# Patient Record
Sex: Male | Born: 1980 | Race: White | Hispanic: No | Marital: Married | Smoking: Former smoker
Health system: Southern US, Community
[De-identification: ages and names within clinical notes are randomized; demographics above are authoritative.]

## PROBLEM LIST (undated history)

## (undated) DIAGNOSIS — D6800 Von Willebrand disease, unspecified: Secondary | ICD-10-CM

## (undated) DIAGNOSIS — Z720 Tobacco use: Secondary | ICD-10-CM

## (undated) DIAGNOSIS — R000 Tachycardia, unspecified: Secondary | ICD-10-CM

## (undated) DIAGNOSIS — S82892A Other fracture of left lower leg, initial encounter for closed fracture: Secondary | ICD-10-CM

---
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
## 1998-02-18 ENCOUNTER — Emergency Department (HOSPITAL_COMMUNITY): Admission: EM | Admit: 1998-02-18 | Discharge: 1998-02-18 | Payer: Self-pay | Admitting: Emergency Medicine

## 1998-05-02 ENCOUNTER — Encounter: Payer: Self-pay | Admitting: Gastroenterology

## 1998-05-02 ENCOUNTER — Ambulatory Visit (HOSPITAL_COMMUNITY): Admission: RE | Admit: 1998-05-02 | Discharge: 1998-05-02 | Payer: Self-pay | Admitting: Gastroenterology

## 1998-05-09 ENCOUNTER — Encounter: Payer: Self-pay | Admitting: Critical Care Medicine

## 1998-05-09 ENCOUNTER — Inpatient Hospital Stay (HOSPITAL_COMMUNITY): Admission: AD | Admit: 1998-05-09 | Discharge: 1998-05-10 | Payer: Self-pay | Admitting: Critical Care Medicine

## 1999-05-06 HISTORY — PX: CYSTIC HYGROMA EXCISION: SHX450

## 1999-08-05 ENCOUNTER — Inpatient Hospital Stay (HOSPITAL_COMMUNITY): Admission: EM | Admit: 1999-08-05 | Discharge: 1999-08-06 | Payer: Self-pay | Admitting: Emergency Medicine

## 1999-08-05 ENCOUNTER — Encounter: Payer: Self-pay | Admitting: Emergency Medicine

## 1999-09-16 ENCOUNTER — Encounter: Payer: Self-pay | Admitting: Urology

## 1999-09-16 ENCOUNTER — Encounter: Admission: RE | Admit: 1999-09-16 | Discharge: 1999-09-16 | Payer: Self-pay | Admitting: Urology

## 1999-10-10 ENCOUNTER — Inpatient Hospital Stay (HOSPITAL_COMMUNITY): Admission: RE | Admit: 1999-10-10 | Discharge: 1999-10-14 | Payer: Self-pay | Admitting: General Surgery

## 1999-10-12 ENCOUNTER — Encounter: Payer: Self-pay | Admitting: General Surgery

## 1999-12-31 ENCOUNTER — Emergency Department (HOSPITAL_COMMUNITY): Admission: EM | Admit: 1999-12-31 | Discharge: 1999-12-31 | Payer: Self-pay | Admitting: Emergency Medicine

## 2000-01-03 ENCOUNTER — Encounter: Payer: Self-pay | Admitting: General Surgery

## 2000-01-03 ENCOUNTER — Encounter: Admission: RE | Admit: 2000-01-03 | Discharge: 2000-01-03 | Payer: Self-pay | Admitting: General Surgery

## 2000-01-04 ENCOUNTER — Encounter: Payer: Self-pay | Admitting: General Surgery

## 2000-01-04 ENCOUNTER — Inpatient Hospital Stay (HOSPITAL_COMMUNITY): Admission: AD | Admit: 2000-01-04 | Discharge: 2000-01-06 | Payer: Self-pay | Admitting: General Surgery

## 2000-06-04 ENCOUNTER — Emergency Department (HOSPITAL_COMMUNITY): Admission: EM | Admit: 2000-06-04 | Discharge: 2000-06-04 | Payer: Self-pay | Admitting: Emergency Medicine

## 2000-08-04 ENCOUNTER — Emergency Department (HOSPITAL_COMMUNITY): Admission: EM | Admit: 2000-08-04 | Discharge: 2000-08-04 | Payer: Self-pay | Admitting: Emergency Medicine

## 2000-08-04 ENCOUNTER — Encounter: Payer: Self-pay | Admitting: Emergency Medicine

## 2002-10-18 ENCOUNTER — Encounter: Payer: Self-pay | Admitting: Emergency Medicine

## 2002-10-18 ENCOUNTER — Emergency Department (HOSPITAL_COMMUNITY): Admission: EM | Admit: 2002-10-18 | Discharge: 2002-10-18 | Payer: Self-pay | Admitting: Emergency Medicine

## 2003-08-09 ENCOUNTER — Emergency Department (HOSPITAL_COMMUNITY): Admission: EM | Admit: 2003-08-09 | Discharge: 2003-08-09 | Payer: Self-pay | Admitting: Emergency Medicine

## 2003-10-19 ENCOUNTER — Emergency Department (HOSPITAL_COMMUNITY): Admission: EM | Admit: 2003-10-19 | Discharge: 2003-10-19 | Payer: Self-pay | Admitting: Family Medicine

## 2003-10-22 ENCOUNTER — Emergency Department (HOSPITAL_COMMUNITY): Admission: EM | Admit: 2003-10-22 | Discharge: 2003-10-22 | Payer: Self-pay | Admitting: Emergency Medicine

## 2005-10-01 ENCOUNTER — Emergency Department (HOSPITAL_COMMUNITY): Admission: EM | Admit: 2005-10-01 | Discharge: 2005-10-01 | Payer: Self-pay | Admitting: Emergency Medicine

## 2008-03-22 ENCOUNTER — Emergency Department: Payer: Self-pay

## 2008-12-03 ENCOUNTER — Emergency Department: Payer: Self-pay | Admitting: Emergency Medicine

## 2008-12-13 ENCOUNTER — Ambulatory Visit: Payer: Self-pay

## 2009-04-04 HISTORY — PX: KNEE ARTHROSCOPY: SHX127

## 2009-04-06 ENCOUNTER — Ambulatory Visit: Payer: Self-pay | Admitting: Unknown Physician Specialty

## 2009-04-13 ENCOUNTER — Ambulatory Visit: Payer: Self-pay | Admitting: Unknown Physician Specialty

## 2009-07-18 ENCOUNTER — Emergency Department: Payer: Self-pay | Admitting: Emergency Medicine

## 2010-12-11 ENCOUNTER — Emergency Department: Payer: Self-pay | Admitting: Emergency Medicine

## 2011-07-08 ENCOUNTER — Emergency Department: Payer: Self-pay | Admitting: *Deleted

## 2012-08-28 ENCOUNTER — Inpatient Hospital Stay: Admit: 2012-08-28 | Payer: Self-pay | Admitting: Orthopedic Surgery

## 2012-08-28 ENCOUNTER — Encounter (HOSPITAL_COMMUNITY): Payer: Self-pay | Admitting: Emergency Medicine

## 2012-08-28 ENCOUNTER — Emergency Department (HOSPITAL_COMMUNITY): Payer: Worker's Compensation

## 2012-08-28 ENCOUNTER — Encounter (HOSPITAL_COMMUNITY)
Admission: EM | Disposition: A | Discharge status: Home Health | Payer: Self-pay | Source: Home / Self Care | Attending: Orthopedic Surgery

## 2012-08-28 ENCOUNTER — Encounter (HOSPITAL_COMMUNITY): Payer: Self-pay | Admitting: Anesthesiology

## 2012-08-28 ENCOUNTER — Inpatient Hospital Stay (HOSPITAL_COMMUNITY): Payer: Worker's Compensation

## 2012-08-28 ENCOUNTER — Emergency Department (HOSPITAL_COMMUNITY): Payer: Worker's Compensation | Admitting: Anesthesiology

## 2012-08-28 ENCOUNTER — Inpatient Hospital Stay (HOSPITAL_COMMUNITY)
Admission: EM | Admit: 2012-08-28 | Discharge: 2012-08-31 | DRG: 493 | Disposition: A | Discharge status: Home Health | Payer: Worker's Compensation | Attending: Orthopedic Surgery | Admitting: Orthopedic Surgery

## 2012-08-28 DIAGNOSIS — S92302A Fracture of unspecified metatarsal bone(s), left foot, initial encounter for closed fracture: Secondary | ICD-10-CM

## 2012-08-28 DIAGNOSIS — Z885 Allergy status to narcotic agent status: Secondary | ICD-10-CM

## 2012-08-28 DIAGNOSIS — Z880 Allergy status to penicillin: Secondary | ICD-10-CM

## 2012-08-28 DIAGNOSIS — W11XXXA Fall on and from ladder, initial encounter: Secondary | ICD-10-CM | POA: Diagnosis present

## 2012-08-28 DIAGNOSIS — Y9269 Other specified industrial and construction area as the place of occurrence of the external cause: Secondary | ICD-10-CM

## 2012-08-28 DIAGNOSIS — D6800 Von Willebrand disease, unspecified: Secondary | ICD-10-CM | POA: Diagnosis present

## 2012-08-28 DIAGNOSIS — S52109A Unspecified fracture of upper end of unspecified radius, initial encounter for closed fracture: Secondary | ICD-10-CM | POA: Diagnosis present

## 2012-08-28 DIAGNOSIS — W19XXXA Unspecified fall, initial encounter: Secondary | ICD-10-CM

## 2012-08-28 DIAGNOSIS — S82409A Unspecified fracture of shaft of unspecified fibula, initial encounter for closed fracture: Principal | ICD-10-CM | POA: Diagnosis present

## 2012-08-28 DIAGNOSIS — S82209A Unspecified fracture of shaft of unspecified tibia, initial encounter for closed fracture: Principal | ICD-10-CM | POA: Diagnosis present

## 2012-08-28 DIAGNOSIS — S92309A Fracture of unspecified metatarsal bone(s), unspecified foot, initial encounter for closed fracture: Secondary | ICD-10-CM | POA: Diagnosis present

## 2012-08-28 DIAGNOSIS — S82872A Displaced pilon fracture of left tibia, initial encounter for closed fracture: Secondary | ICD-10-CM

## 2012-08-28 DIAGNOSIS — T79A29A Traumatic compartment syndrome of unspecified lower extremity, initial encounter: Secondary | ICD-10-CM | POA: Diagnosis present

## 2012-08-28 DIAGNOSIS — S52102A Unspecified fracture of upper end of left radius, initial encounter for closed fracture: Secondary | ICD-10-CM

## 2012-08-28 DIAGNOSIS — F17200 Nicotine dependence, unspecified, uncomplicated: Secondary | ICD-10-CM | POA: Diagnosis present

## 2012-08-28 DIAGNOSIS — Y990 Civilian activity done for income or pay: Secondary | ICD-10-CM

## 2012-08-28 DIAGNOSIS — S9305XA Dislocation of left ankle joint, initial encounter: Secondary | ICD-10-CM

## 2012-08-28 HISTORY — PX: APPLICATION OF WOUND VAC: SHX5189

## 2012-08-28 HISTORY — DX: Von Willebrand's disease: D68.0

## 2012-08-28 HISTORY — PX: DORSAL COMPARTMENT RELEASE: SHX5039

## 2012-08-28 HISTORY — DX: Von Willebrand disease, unspecified: D68.00

## 2012-08-28 HISTORY — PX: EXTERNAL FIXATION LEG: SHX1549

## 2012-08-28 LAB — CBC WITH DIFFERENTIAL/PLATELET
Basophils Absolute: 0 10*3/uL (ref 0.0–0.1)
Basophils Relative: 0 % (ref 0–1)
Eosinophils Absolute: 0 10*3/uL (ref 0.0–0.7)
Eosinophils Relative: 0 % (ref 0–5)
Hemoglobin: 14.2 g/dL (ref 13.0–17.0)
Lymphocytes Relative: 13 % (ref 12–46)
MCH: 33.5 pg (ref 26.0–34.0)
Monocytes Relative: 6 % (ref 3–12)
RBC: 4.24 MIL/uL (ref 4.22–5.81)
RDW: 12.7 % (ref 11.5–15.5)

## 2012-08-28 LAB — POCT I-STAT, CHEM 8
Calcium, Ion: 1.16 mmol/L (ref 1.12–1.23)
HCT: 41 % (ref 39.0–52.0)
Hemoglobin: 13.9 g/dL (ref 13.0–17.0)
TCO2: 25 mmol/L (ref 0–100)

## 2012-08-28 LAB — PROTIME-INR
INR: 0.94 (ref 0.00–1.49)
Prothrombin Time: 12.5 seconds (ref 11.6–15.2)

## 2012-08-28 LAB — RAPID URINE DRUG SCREEN, HOSP PERFORMED
Benzodiazepines: POSITIVE — AB
Cocaine: NOT DETECTED
Opiates: NOT DETECTED

## 2012-08-28 SURGERY — EXTERNAL FIXATION, LOWER EXTREMITY
Anesthesia: Choice | Laterality: Left

## 2012-08-28 SURGERY — EXTERNAL FIXATION, LOWER EXTREMITY
Anesthesia: General | Site: Leg Lower | Laterality: Left | Wound class: Clean

## 2012-08-28 MED ORDER — SODIUM CHLORIDE 0.9 % IR SOLN
Status: DC | PRN
  Administered 2012-08-28: 1

## 2012-08-28 MED ORDER — VANCOMYCIN HCL 10 G IV SOLR
1500.0000 mg | Freq: Once | INTRAVENOUS | Status: AC
  Administered 2012-08-28: 1500 mg via INTRAVENOUS
  Filled 2012-08-28: qty 1500

## 2012-08-28 MED ORDER — PROPOFOL 10 MG/ML IV BOLUS
0.5000 mg/kg | Freq: Once | INTRAVENOUS | Status: AC
  Administered 2012-08-28: 50 mg via INTRAVENOUS
  Filled 2012-08-28: qty 1

## 2012-08-28 MED ORDER — DIPHENHYDRAMINE HCL 12.5 MG/5ML PO ELIX: 12.5000 mg | ORAL_SOLUTION | Freq: Four times a day (QID) | ORAL | Status: DC | PRN

## 2012-08-28 MED ORDER — FENTANYL CITRATE 0.05 MG/ML IJ SOLN
100.0000 ug | Freq: Once | INTRAMUSCULAR | Status: AC
  Administered 2012-08-28: 100 ug via INTRAVENOUS

## 2012-08-28 MED ORDER — NEOSTIGMINE METHYLSULFATE 1 MG/ML IJ SOLN
INTRAMUSCULAR | Status: DC | PRN
  Administered 2012-08-28: 5 mg via INTRAVENOUS

## 2012-08-28 MED ORDER — DEXAMETHASONE SODIUM PHOSPHATE 4 MG/ML IJ SOLN
INTRAMUSCULAR | Status: DC | PRN
  Administered 2012-08-28: 8 mg via INTRAVENOUS

## 2012-08-28 MED ORDER — FENTANYL CITRATE 0.05 MG/ML IJ SOLN
INTRAMUSCULAR | Status: AC
  Administered 2012-08-28: 100 ug
  Filled 2012-08-28: qty 2

## 2012-08-28 MED ORDER — POTASSIUM CHLORIDE IN NACL 20-0.9 MEQ/L-% IV SOLN
INTRAVENOUS | Status: DC
  Administered 2012-08-29: 75 mL/h via INTRAVENOUS
  Filled 2012-08-28 (×2): qty 1000

## 2012-08-28 MED ORDER — ARTIFICIAL TEARS OP OINT
TOPICAL_OINTMENT | OPHTHALMIC | Status: DC | PRN
  Administered 2012-08-28: 1 via OPHTHALMIC

## 2012-08-28 MED ORDER — MIDAZOLAM HCL 5 MG/5ML IJ SOLN
INTRAMUSCULAR | Status: DC | PRN
  Administered 2012-08-28: 2 mg via INTRAVENOUS

## 2012-08-28 MED ORDER — FENTANYL 10 MCG/ML IV SOLN
INTRAVENOUS | Status: DC
  Administered 2012-08-29 (×2): via INTRAVENOUS
  Filled 2012-08-28 (×2): qty 50

## 2012-08-28 MED ORDER — LACTATED RINGERS IV SOLN
INTRAVENOUS | Status: DC | PRN
  Administered 2012-08-28 (×3): via INTRAVENOUS

## 2012-08-28 MED ORDER — ACETAMINOPHEN 10 MG/ML IV SOLN
1000.0000 mg | Freq: Once | INTRAVENOUS | Status: AC
  Administered 2012-08-28: 1000 mg via INTRAVENOUS
  Filled 2012-08-28: qty 100

## 2012-08-28 MED ORDER — CHLORHEXIDINE GLUCONATE 4 % EX LIQD
60.0000 mL | Freq: Once | CUTANEOUS | Status: DC
  Filled 2012-08-28: qty 60

## 2012-08-28 MED ORDER — ONDANSETRON HCL 4 MG/2ML IJ SOLN: 4.0000 mg | Freq: Once | INTRAMUSCULAR | Status: AC | PRN

## 2012-08-28 MED ORDER — ONDANSETRON HCL 4 MG/2ML IJ SOLN: 4.0000 mg | Freq: Four times a day (QID) | INTRAMUSCULAR | Status: DC | PRN

## 2012-08-28 MED ORDER — ONDANSETRON HCL 4 MG/2ML IJ SOLN
INTRAMUSCULAR | Status: DC | PRN
  Administered 2012-08-28: 4 mg via INTRAVENOUS

## 2012-08-28 MED ORDER — FENTANYL CITRATE 0.05 MG/ML IJ SOLN
100.0000 ug | INTRAMUSCULAR | Status: AC | PRN
  Administered 2012-08-28 (×3): 100 ug via INTRAVENOUS
  Filled 2012-08-28: qty 10

## 2012-08-28 MED ORDER — SODIUM CHLORIDE 0.9 % IJ SOLN: 9.0000 mL | INTRAMUSCULAR | Status: DC | PRN

## 2012-08-28 MED ORDER — SUCCINYLCHOLINE CHLORIDE 20 MG/ML IJ SOLN
INTRAMUSCULAR | Status: DC | PRN
  Administered 2012-08-28: 120 mg via INTRAVENOUS

## 2012-08-28 MED ORDER — DIPHENHYDRAMINE HCL 50 MG/ML IJ SOLN: 12.5000 mg | Freq: Four times a day (QID) | INTRAMUSCULAR | Status: DC | PRN

## 2012-08-28 MED ORDER — FENTANYL CITRATE 0.05 MG/ML IJ SOLN
100.0000 ug | Freq: Once | INTRAMUSCULAR | Status: AC
  Administered 2012-08-28: 100 ug via INTRAVENOUS
  Filled 2012-08-28 (×2): qty 2

## 2012-08-28 MED ORDER — GLYCOPYRROLATE 0.2 MG/ML IJ SOLN
INTRAMUSCULAR | Status: DC | PRN
  Administered 2012-08-28: 0.6 mg via INTRAVENOUS

## 2012-08-28 MED ORDER — VANCOMYCIN HCL IN DEXTROSE 1-5 GM/200ML-% IV SOLN
1000.0000 mg | INTRAVENOUS | Status: DC
  Filled 2012-08-28: qty 200

## 2012-08-28 MED ORDER — FENTANYL CITRATE 0.05 MG/ML IJ SOLN
INTRAMUSCULAR | Status: DC | PRN
  Administered 2012-08-28: 100 ug via INTRAVENOUS
  Administered 2012-08-28: 50 ug via INTRAVENOUS
  Administered 2012-08-28 (×2): 150 ug via INTRAVENOUS
  Administered 2012-08-28 (×3): 50 ug via INTRAVENOUS

## 2012-08-28 MED ORDER — PROPOFOL 10 MG/ML IV BOLUS
INTRAVENOUS | Status: DC | PRN
  Administered 2012-08-28: 180 mg via INTRAVENOUS

## 2012-08-28 MED ORDER — VECURONIUM BROMIDE 10 MG IV SOLR
INTRAVENOUS | Status: DC | PRN
  Administered 2012-08-28: 2 mg via INTRAVENOUS
  Administered 2012-08-28: 8 mg via INTRAVENOUS
  Administered 2012-08-28: 2 mg via INTRAVENOUS

## 2012-08-28 MED ORDER — ACETAMINOPHEN 10 MG/ML IV SOLN: 1000.0000 mg | Freq: Once | INTRAVENOUS | Status: AC | PRN

## 2012-08-28 MED ORDER — NALOXONE HCL 0.4 MG/ML IJ SOLN: 0.4000 mg | INTRAMUSCULAR | Status: DC | PRN

## 2012-08-28 MED ORDER — SODIUM CHLORIDE 0.9 % IV SOLN
INTRAVENOUS | Status: DC
  Administered 2012-08-28 (×2): via INTRAVENOUS

## 2012-08-28 MED ORDER — HYDROMORPHONE HCL PF 1 MG/ML IJ SOLN: 0.2500 mg | INTRAMUSCULAR | Status: DC | PRN

## 2012-08-28 MED ORDER — LIDOCAINE HCL (CARDIAC) 20 MG/ML IV SOLN
INTRAVENOUS | Status: DC | PRN
  Administered 2012-08-28 (×2): 50 mg via INTRAVENOUS

## 2012-08-28 SURGICAL SUPPLY — 67 items
11X150MM BAR ×2 IMPLANT
11X400MM BAR ×2 IMPLANT
BANDAGE ELASTIC 3 VELCRO ST LF (GAUZE/BANDAGES/DRESSINGS) ×4 IMPLANT
BANDAGE ELASTIC 4 VELCRO ST LF (GAUZE/BANDAGES/DRESSINGS) ×4 IMPLANT
BANDAGE ELASTIC 6 VELCRO ST LF (GAUZE/BANDAGES/DRESSINGS) ×2 IMPLANT
BANDAGE GAUZE ELAST BULKY 4 IN (GAUZE/BANDAGES/DRESSINGS) ×2 IMPLANT
BAR GLASS FIBER EXFX 11X500 (MISCELLANEOUS) ×2 IMPLANT
BNDG COHESIVE 6X5 TAN STRL LF (GAUZE/BANDAGES/DRESSINGS) ×2 IMPLANT
BNDG ELASTIC 2 VLCR STRL LF (GAUZE/BANDAGES/DRESSINGS) ×2 IMPLANT
BRUSH SCRUB DISP (MISCELLANEOUS) ×4 IMPLANT
CAP PROTECTIVE TRANSFX EX-FIX (CAP) ×2 IMPLANT
CATH ROBINSON RED A/P 14FR (CATHETERS) ×2 IMPLANT
CLAMP BLUE BAR TO BAR (MISCELLANEOUS) ×4 IMPLANT
CLAMP BLUE BAR TO PIN (MISCELLANEOUS) ×8 IMPLANT
CLOTH BEACON ORANGE TIMEOUT ST (SAFETY) ×2 IMPLANT
CONNECTOR Y ATS VAC SYSTEM (MISCELLANEOUS) ×2 IMPLANT
CONT SPEC 4OZ CLIKSEAL STRL BL (MISCELLANEOUS) ×2 IMPLANT
COVER SURGICAL LIGHT HANDLE (MISCELLANEOUS) ×2 IMPLANT
DRAPE C-ARM 42X72 X-RAY (DRAPES) ×2 IMPLANT
DRAPE C-ARMOR (DRAPES) ×2 IMPLANT
DRAPE U-SHAPE 47X51 STRL (DRAPES) ×2 IMPLANT
DRSG ADAPTIC 3X8 NADH LF (GAUZE/BANDAGES/DRESSINGS) ×2 IMPLANT
DRSG VAC ATS MED SENSATRAC (GAUZE/BANDAGES/DRESSINGS) ×2 IMPLANT
ELECT REM PT RETURN 9FT ADLT (ELECTROSURGICAL) ×2
ELECTRODE REM PT RTRN 9FT ADLT (ELECTROSURGICAL) ×1 IMPLANT
GEL ULTRASOUND 20GR AQUASONIC (MISCELLANEOUS) ×2 IMPLANT
GLOVE BIO SURGEON STRL SZ7.5 (GLOVE) ×2 IMPLANT
GLOVE BIO SURGEON STRL SZ8 (GLOVE) ×6 IMPLANT
GLOVE BIOGEL PI IND STRL 7.5 (GLOVE) ×1 IMPLANT
GLOVE BIOGEL PI IND STRL 8 (GLOVE) ×2 IMPLANT
GLOVE BIOGEL PI IND STRL 8.5 (GLOVE) ×1 IMPLANT
GLOVE BIOGEL PI INDICATOR 7.5 (GLOVE) ×1
GLOVE BIOGEL PI INDICATOR 8 (GLOVE) ×2
GLOVE BIOGEL PI INDICATOR 8.5 (GLOVE) ×1
GOWN PREVENTION PLUS XLARGE (GOWN DISPOSABLE) ×2 IMPLANT
GOWN STRL NON-REIN LRG LVL3 (GOWN DISPOSABLE) ×4 IMPLANT
HALF PIN 5.0X160 (PIN) ×4 IMPLANT
HANDPIECE INTERPULSE COAX TIP (DISPOSABLE)
KIT BASIN OR (CUSTOM PROCEDURE TRAY) ×2 IMPLANT
KIT ROOM TURNOVER OR (KITS) ×2 IMPLANT
NS IRRIG 1000ML POUR BTL (IV SOLUTION) ×2 IMPLANT
PACK ORTHO EXTREMITY (CUSTOM PROCEDURE TRAY) ×2 IMPLANT
PAD ARMBOARD 7.5X6 YLW CONV (MISCELLANEOUS) ×4 IMPLANT
PAD NEG PRESSURE SENSATRAC (MISCELLANEOUS) ×2 IMPLANT
PADDING CAST COTTON 6X4 STRL (CAST SUPPLIES) ×6 IMPLANT
PIN 4X100X20MM (PIN) ×4 IMPLANT
PIN CLAMP 2BAR 75MM BLUE (PIN) ×2 IMPLANT
PIN TRANSFIXING 5.0 (PIN) IMPLANT
SET HNDPC FAN SPRY TIP SCT (DISPOSABLE) IMPLANT
SET MONITOR QUICK PRESSURE (MISCELLANEOUS) ×2 IMPLANT
SLING ARM IMMOBILIZER LRG (SOFTGOODS) ×2 IMPLANT
SPONGE GAUZE 4X4 12PLY (GAUZE/BANDAGES/DRESSINGS) ×2 IMPLANT
SPONGE LAP 18X18 X RAY DECT (DISPOSABLE) ×2 IMPLANT
SPONGE SCRUB IODOPHOR (GAUZE/BANDAGES/DRESSINGS) ×2 IMPLANT
STAPLER VISISTAT 35W (STAPLE) IMPLANT
STOCKINETTE IMPERVIOUS LG (DRAPES) ×2 IMPLANT
STRIP CLOSURE SKIN 1/2X4 (GAUZE/BANDAGES/DRESSINGS) IMPLANT
SUT ETHILON 3 0 PS 1 (SUTURE) IMPLANT
SUT VIC AB 0 CT1 27 (SUTURE) ×2
SUT VIC AB 0 CT1 27XBRD ANBCTR (SUTURE) ×2 IMPLANT
SUT VIC AB 2-0 CT1 27 (SUTURE) ×2
SUT VIC AB 2-0 CT1 TAPERPNT 27 (SUTURE) ×2 IMPLANT
TOWEL OR 17X24 6PK STRL BLUE (TOWEL DISPOSABLE) ×2 IMPLANT
TOWEL OR 17X26 10 PK STRL BLUE (TOWEL DISPOSABLE) ×2 IMPLANT
TUBE CONNECTING 12X1/4 (SUCTIONS) ×2 IMPLANT
UNDERPAD 30X30 INCONTINENT (UNDERPADS AND DIAPERS) ×4 IMPLANT
YANKAUER SUCT BULB TIP NO VENT (SUCTIONS) ×2 IMPLANT

## 2012-08-28 NOTE — Transfer of Care (Signed)
Immediate Anesthesia Transfer of Care Note  Patient: Ryan Santiago  Procedure(s) Performed: Procedure(s): EXTERNAL FIXATION Left Tibia fuibula fracture (Left) RELEASE DORSAL COMPARTMENT (DEQUERVAIN) (Left) APPLICATION OF WOUND VAC (Left)  Patient Location: PACU  Anesthesia Type:General  Level of Consciousness: oriented, sedated, patient cooperative and responds to stimulation  Airway & Oxygen Therapy: Patient Spontanous Breathing and Patient connected to nasal cannula oxygen  Post-op Assessment: Report given to PACU RN, Post -op Vital signs reviewed and stable, Patient moving all extremities and Patient moving all extremities X 4  Post vital signs: Reviewed and stable  Complications: No apparent anesthesia complications

## 2012-08-28 NOTE — Anesthesia Procedure Notes (Signed)
Procedure Name: Intubation Date/Time: 08/28/2012 8:33 PM Performed by: Wray Kearns A Pre-anesthesia Checklist: Patient identified, Timeout performed, Emergency Drugs available, Suction available and Patient being monitored Patient Re-evaluated:Patient Re-evaluated prior to inductionOxygen Delivery Method: Circle system utilized Preoxygenation: Pre-oxygenation with 100% oxygen Intubation Type: IV induction, Rapid sequence and Cricoid Pressure applied Laryngoscope Size: Mac and 4 Grade View: Grade I Tube type: Oral Tube size: 8.0 mm Number of attempts: 1 Airway Equipment and Method: Stylet Placement Confirmation: ETT inserted through vocal cords under direct vision,  breath sounds checked- equal and bilateral and positive ETCO2 Secured at: 23 cm Tube secured with: Tape Dental Injury: Teeth and Oropharynx as per pre-operative assessment

## 2012-08-28 NOTE — Consult Note (Signed)
Orthopaedic Trauma Service Consultation  Reason for Consult: L radial head and neck fracture; L pilon fracture dislocation Referring Physician: Jafeth, Mustin is an 32 y.o. male.  HPI: Ryan Santiago circa 10 feet off ladder and landed on the bottom rung immediately before impact. Unsuccessful relocation attempt by Dr. Lynelle Doctor, c/o severe pain, tingling in his foot and calf. Denies LOC or head injury.    History reviewed. No pertinent past medical history.  Past Surgical History  Procedure Laterality Date  . Colon surgery      small intestinal removal    No family history on file.  Social History:  reports that he has been smoking Cigarettes.  He has been smoking about 1.00 pack per day. He has never used smokeless tobacco. He reports that he does not drink alcohol or use illicit drugs.  Allergies:  Allergies  Allergen Reactions  . Morphine And Related Anaphylaxis    Arm became swollen, ultimately intubated  . Penicillins Hives    Medications: I have reviewed the patient's current medications.  Results for orders placed during the hospital encounter of 08/28/12 (from the past 48 hour(s))  CBC WITH DIFFERENTIAL     Status: Abnormal   Collection Time    08/28/12  5:10 PM      Result Value Range   WBC 17.8 (*) 4.0 - 10.5 K/uL   RBC 4.24  4.22 - 5.81 MIL/uL   Hemoglobin 14.2  13.0 - 17.0 g/dL   HCT 16.1  09.6 - 04.5 %   MCV 93.2  78.0 - 100.0 fL   MCH 33.5  26.0 - 34.0 pg   MCHC 35.9  30.0 - 36.0 g/dL   RDW 40.9  81.1 - 91.4 %   Platelets 216  150 - 400 K/uL   Neutrophils Relative 81 (*) 43 - 77 %   Neutro Abs 14.4 (*) 1.7 - 7.7 K/uL   Lymphocytes Relative 13  12 - 46 %   Lymphs Abs 2.2  0.7 - 4.0 K/uL   Monocytes Relative 6  3 - 12 %   Monocytes Absolute 1.1 (*) 0.1 - 1.0 K/uL   Eosinophils Relative 0  0 - 5 %   Eosinophils Absolute 0.0  0.0 - 0.7 K/uL   Basophils Relative 0  0 - 1 %   Basophils Absolute 0.0  0.0 - 0.1 K/uL  POCT I-STAT, CHEM 8     Status: Abnormal    Collection Time    08/28/12  5:40 PM      Result Value Range   Sodium 144  135 - 145 mEq/L   Potassium 4.0  3.5 - 5.1 mEq/L   Chloride 110  96 - 112 mEq/L   BUN 17  6 - 23 mg/dL   Creatinine, Ser 7.82  0.50 - 1.35 mg/dL   Glucose, Bld 956 (*) 70 - 99 mg/dL   Calcium, Ion 2.13  0.86 - 1.23 mmol/L   TCO2 25  0 - 100 mmol/L   Hemoglobin 13.9  13.0 - 17.0 g/dL   HCT 57.8  46.9 - 62.9 %    Dg Elbow Complete Left  08/28/2012  *RADIOLOGY REPORT*  Clinical Data: Left elbow pain following injury.  LEFT ELBOW - COMPLETE 3+ VIEW  Comparison: None  Findings: A nondisplaced radial head fracture is present. A joint effusion is noted. No other fracture is identified. There is no evidence of subluxation or dislocation. No focal bony lesions are noted.  IMPRESSION: Radial head fracture with  joint effusion.   Original Report Authenticated By: Harmon Pier, M.D.    Dg Knee 1-2 Views Left  08/28/2012  *RADIOLOGY REPORT*  Clinical Data: Left knee injury and pain.  LEFT KNEE - 1-2 VIEW  Comparison: None  Findings: No evidence of acute fracture, subluxation or dislocation identified.  No joint effusion noted.  No radio-opaque foreign bodies are present.  No focal bony lesions are noted.  The joint spaces are unremarkable.  IMPRESSION: Unremarkable left knee.   Original Report Authenticated By: Harmon Pier, M.D.    Dg Tibia/fibula Left  08/28/2012  *RADIOLOGY REPORT*  Clinical Data: Fall off ladder with lower leg pain.  LEFT TIBIA AND FIBULA - 2 VIEW  Comparison: None  Findings: Tibiotalar dislocation is identified. Fractures of the distal fibula, medial malleolus, lateral distal tibia and lateral process of the talus are present. No proximal fibular or tibial fractures are present. The knee is unremarkable.  IMPRESSION: Tibiotalar dislocation with ankle fractures as described.   Original Report Authenticated By: Harmon Pier, M.D.    Dg Ankle 2 Views Left  08/28/2012  *RADIOLOGY REPORT*  Clinical Data: Fall off  ladder with left ankle injury, pain and swelling.  LEFT ANKLE - 2 VIEW  Comparison: None  Findings:   Anterior lateral dislocation of the tibiotalar joint noted. A comminuted fracture and angulated fracture of the distal fibular diaphysis is present. A medial malleolar fracture is identified with 2 cm lateral displacement. Displaced fracture fragments from the lateral distal tibia and anterior articular surface are noted. A few tiny fracture fragments from the lateral process of the talus noted.  IMPRESSION: Tibiotalar dislocation with ankle fractures as described.   Original Report Authenticated By: Harmon Pier, M.D.     Blood pressure 130/72, pulse 85, temperature 98 F (36.7 C), resp. rate 14, weight 200 lb (90.719 kg), SpO2 96.00%. [place]/AT, in clear distress from pain CTA RRR S,NT,ND LLE significant swelling, moderate deformity, medial bruising and abrasion, no frank bleeding or wound  Sens DPN, SPN, TN intact  Motor intact but unable to assess strength for EHL, ext, flex  DP 2+, PT obscured by swelling UEx  Elbow tender radial head, abrasions dorsally  shoulder, wrist, digits- no skin wounds, nontender, no instability, no blocks to motion  Sens  Ax/R/M/U intact  Mot   Ax/ R/ PIN/ M/ AIN/ U intact  Rad 2+   Assessment/Plan: L pilon fracture dislocation with possible compartment syndrome L radial head and neck fractures with minimal displacement  Under conscious sedation with Propofol, and supplemented with Fentanyl, repeat reduction and manipulation performed with significant instability noted and inability to effectively maintain the ankle in a reduced position.  Plan emergent transfer to [facility] for spanning ex-fix and possible compartment release  No OR availability at [facility] until midnight secondary to two consecutive emergecies Will CT scan both elbow and pilon post op ICE, elevation  I discussed with the patient and his wife the risks and benefits of surgery, including the possibility  of infection, nerve injury, vessel injury, wound breakdown, arthritis, symptomatic hardware, DVT/ PE, loss of motion, compartment syndrome, and need for further surgery among others.  We also specifically discussed the need to stage surgery because of the elevated risk of soft tissue breakdown that could lead to amputation.  They understood these risks and wished to proceed.  Myrene Galas, MD [facility] [phone] (p)  08/28/2012  6:27 PM

## 2012-08-28 NOTE — Brief Op Note (Signed)
08/28/2012  10:47 PM  PATIENT:  Ryan Santiago  32 y.o. male  PRE-OPERATIVE DIAGNOSIS:  Pilon ankle fracture and dislocation, possible compartment syndrome  POST-OPERATIVE DIAGNOSIS:  pilon fracture left ankle and dislocation, compartment syndrome  PROCEDURE:   1. Closed reduction of left ankle dislocation 2. Closed manipulation and reduction of tibial pilon fracture, tibia and fibula 3. Application of spanning external fixator 4. Four compartment fasciotomies left leg 5. Application of large wound vac, 70cm2 6. Measurement of compartment pressures x 4  SURGEON:  Surgeon(s) and Role:    * Budd Palmer, MD - Primary  PHYSICIAN ASSISTANT: Montez Morita, [facility]  ANESTHESIA:   general  EBL:  Total I/O In: 2500 [I.V.:2500] Out: -   BLOOD ADMINISTERED:none  DRAINS: wound vac   LOCAL MEDICATIONS USED:  NONE  SPECIMEN:  No Specimen  DISPOSITION OF SPECIMEN:  N/A  COUNTS:  YES  TOURNIQUET:  * No tourniquets in log *  DICTATION: .Other Dictation: Dictation Number [id number]  PLAN OF CARE: Admit to inpatient   PATIENT DISPOSITION:  PACU - hemodynamically stable.   Delay start of Pharmacological VTE agent (>24hrs) due to surgical blood loss or risk of bleeding: no

## 2012-08-28 NOTE — Anesthesia Preprocedure Evaluation (Signed)
Anesthesia Evaluation  Patient identified by MRN, date of birth, ID band Patient awake    Reviewed: Allergy & Precautions, H&P , NPO status , Patient's Chart, lab work & pertinent test results  Airway Mallampati: II TM Distance: >3 FB Neck ROM: Full    Dental  (+) Teeth Intact and Dental Advisory Given   Pulmonary  breath sounds clear to auscultation        Cardiovascular Rhythm:Regular Rate:Normal     Neuro/Psych    GI/Hepatic   Endo/Other    Renal/GU      Musculoskeletal   Abdominal (+)  Abdomen: soft.    Peds  Hematology   Anesthesia Other Findings   Reproductive/Obstetrics                           Anesthesia Physical Anesthesia Plan  ASA: II  Anesthesia Plan: General   Post-op Pain Management:    Induction: Intravenous  Airway Management Planned: Oral ETT  Additional Equipment:   Intra-op Plan:   Post-operative Plan: Extubation in OR  Informed Consent: I have reviewed the patients History and Physical, chart, labs and discussed the procedure including the risks, benefits and alternatives for the proposed anesthesia with the patient or authorized representative who has indicated his/her understanding and acceptance.   Dental advisory given  Plan Discussed with: CRNA and Surgeon  Anesthesia Plan Comments: (L pilon fracture unstable Nondisplaced r. Radius fracture  Plan GA with oral ETT  Kipp Brood, MD)        Anesthesia Quick Evaluation

## 2012-08-28 NOTE — ED Notes (Signed)
Per EMS - Pt was coming off of roof was on 2nd from top rung of ladder when pt fell to ground landing on left side, impact abrasion/rash to left forearm, left leg is externally rotated from mid lower leg to foot and is supported in fixed leg splint applied on scene. Pt has # 20 in right hand and has received 500 mcg Fentanyl between 1450 and 1540. Denies loss of consciousness and AAO to time, place, date. Wife is at bedside.

## 2012-08-28 NOTE — ED Provider Notes (Signed)
History    CSN: [id number] Arrival date & time 08/28/12  1549 First MD Initiated Contact with Patient 08/28/12 1551     Chief Complaint  Patient presents with  . Fall  . Leg Injury   HPI The patient presents to the emergency room after falling off a ladder.  Patient was at the second rung from the top when he fell landing onto the ground onto his left side.  Patient impacted his left arm and left leg. The patient's left ankle is severely deformed and deviated to the left. The pain is severe. It increases with any minimal movement. Patient was transported by EMS. He was given 500 mcg of fentanyl over the one-hour transport. Patient denies any head injury. He denies any loss of consciousness. Denies any neck pain or back pain. History reviewed. No pertinent past medical history.  Past Surgical History  Procedure Laterality Date  . Colon surgery      small intestinal removal    No family history on file.  History  Substance Use Topics  . Smoking status: Current Every Day Smoker -- 1.00 packs/day    Types: Cigarettes  . Smokeless tobacco: Never Used  . Alcohol Use: No      Review of Systems  All other systems reviewed and are negative.    Allergies  Morphine and related and Penicillins  Home Medications   Current Outpatient Rx  Name  Route  Sig  Dispense  Refill  . ibuprofen (ADVIL,MOTRIN) 200 MG tablet   Oral   Take 200 mg by mouth every 6 (six) hours as needed for pain.           BP 134/118  Pulse 89  Temp(Src) 98 F (36.7 C)  Resp 24  Wt 200 lb (90.719 kg)  SpO2 94%  Physical Exam  Nursing note and vitals reviewed. Constitutional: He appears well-developed and well-nourished. No distress.  HENT:  Head: Normocephalic and atraumatic.  Right Ear: External ear normal.  Left Ear: External ear normal.  Eyes: Conjunctivae are normal. Right eye exhibits no discharge. Left eye exhibits no discharge. No scleral icterus.  Neck: Neck supple. No tracheal deviation  present.  Cardiovascular: Normal rate and regular rhythm.   Pulmonary/Chest: Effort normal and breath sounds normal. No stridor. No respiratory distress.  Musculoskeletal: He exhibits no edema.       Left elbow: He exhibits decreased range of motion, swelling and effusion. He exhibits no deformity and no laceration. Tenderness found.       Left knee: He exhibits decreased range of motion, swelling and effusion. He exhibits no deformity. Tenderness found.       Left ankle: He exhibits swelling and deformity. He exhibits no laceration and normal pulse. Tenderness. Lateral malleolus and medial malleolus tenderness found.  Left foot is deformed and externally rotated with gross dislocation, contusion and tenting of the skin but no laceration or bleeding  Neurological: He is alert. Cranial nerve deficit: no gross deficits.  Skin: Skin is warm and dry. No rash noted.  Psychiatric: He has a normal mood and affect.    ED Course  Procedural sedation Date/Time: 08/28/2012 6:17 PM Performed by: Linwood Dibbles R Authorized by: Linwood Dibbles R Consent: Verbal consent obtained. written consent obtained. Risks and benefits: risks, benefits and alternatives were discussed Consent given by: patient Patient understanding: patient states understanding of the procedure being performed Patient consent: the patient's understanding of the procedure matches consent given Procedure consent: procedure consent matches procedure scheduled Relevant documents:  relevant documents present and verified Test results: test results available and properly labeled Imaging studies: imaging studies available Patient identity confirmed: verbally with patient Time out: Immediately prior to procedure a "time out" was called to verify the correct patient, procedure, equipment, support staff and site/side marked as required. Patient sedated: yes Sedation type: moderate (conscious) sedation Sedatives: propofol Sedation end date/time:  08/28/2012 6:21 PM Vitals: Vital signs were monitored during sedation. Patient tolerance: Patient tolerated the procedure well with no immediate complications.   (including critical care time) Procedures: Pt's ankle was grossly deformed.  Tenting of the skin.  Prior to films, pt was given a dose of fentanyl.  Reduction of the ankle was attempted with traction and internal rotation.  Slight improvement but still deformed.  Skin no longer tenting.  Ankle x-rays obtained.  Inadequate reduction.  Will plan on procedural sedation with propofol.  Will consult with orthopedic surgery. Medications  0.9 %  sodium chloride infusion ( Intravenous New Bag/Given 08/28/12 1618)  fentaNYL (SUBLIMAZE) injection 100 mcg (100 mcg Intravenous Given 08/28/12 1735)  fentaNYL (SUBLIMAZE) 0.05 MG/ML injection (100 mcg  Given 08/28/12 1614)  fentaNYL (SUBLIMAZE) injection 100 mcg (100 mcg Intravenous Given 08/28/12 1646)  propofol (DIPRIVAN) 10 mg/mL bolus/IV push 45.4 mg (50 mg Intravenous New Bag/Given 08/28/12 1758)  fentaNYL (SUBLIMAZE) injection 100 mcg (100 mcg Intravenous Given 08/28/12 1615)  Multiple doses of fentanyl to manage patient's pain.  Labs Reviewed  CBC WITH DIFFERENTIAL - Abnormal; Notable for the following:    WBC 17.8 (*)    Neutrophils Relative 81 (*)    Neutro Abs 14.4 (*)    Monocytes Absolute 1.1 (*)    All other components within normal limits  POCT I-STAT, CHEM 8 - Abnormal; Notable for the following:    Glucose, Bld 118 (*)    All other components within normal limits   Dg Elbow Complete Left  08/28/2012  *RADIOLOGY REPORT*  Clinical Data: Left elbow pain following injury.  LEFT ELBOW - COMPLETE 3+ VIEW  Comparison: None  Findings: A nondisplaced radial head fracture is present. A joint effusion is noted. No other fracture is identified. There is no evidence of subluxation or dislocation. No focal bony lesions are noted.  IMPRESSION: Radial head fracture with joint effusion.   Original  Report Authenticated By: Harmon Pier, M.D.    Dg Knee 1-2 Views Left  08/28/2012  *RADIOLOGY REPORT*  Clinical Data: Left knee injury and pain.  LEFT KNEE - 1-2 VIEW  Comparison: None  Findings: No evidence of acute fracture, subluxation or dislocation identified.  No joint effusion noted.  No radio-opaque foreign bodies are present.  No focal bony lesions are noted.  The joint spaces are unremarkable.  IMPRESSION: Unremarkable left knee.   Original Report Authenticated By: Harmon Pier, M.D.    Dg Tibia/fibula Left  08/28/2012  *RADIOLOGY REPORT*  Clinical Data: Fall off ladder with lower leg pain.  LEFT TIBIA AND FIBULA - 2 VIEW  Comparison: None  Findings: Tibiotalar dislocation is identified. Fractures of the distal fibula, medial malleolus, lateral distal tibia and lateral process of the talus are present. No proximal fibular or tibial fractures are present. The knee is unremarkable.  IMPRESSION: Tibiotalar dislocation with ankle fractures as described.   Original Report Authenticated By: Harmon Pier, M.D.    Dg Ankle 2 Views Left  08/28/2012  *RADIOLOGY REPORT*  Clinical Data: Fall off ladder with left ankle injury, pain and swelling.  LEFT ANKLE - 2 VIEW  Comparison: None  Findings:   Anterior lateral dislocation of the tibiotalar joint noted. A comminuted fracture and angulated fracture of the distal fibular diaphysis is present. A medial malleolar fracture is identified with 2 cm lateral displacement. Displaced fracture fragments from the lateral distal tibia and anterior articular surface are noted. A few tiny fracture fragments from the lateral process of the talus noted.  IMPRESSION: Tibiotalar dislocation with ankle fractures as described.   Original Report Authenticated By: Harmon Pier, M.D.      1. Fracture of proximal end of radius, left, closed, initial encounter   2. Ankle dislocation, left, initial encounter       MDM   Pt underwent additional reduction by Dr Carola Frost while I provided  sedation.  Plan is to take patient to the OR.    No other injuries identified.  Pt without additional complaints.      Celene Kras, MD 08/28/12 Rickey Primus

## 2012-08-28 NOTE — Preoperative (Signed)
Beta Blockers   Reason not to administer Beta Blockers:Not Applicable 

## 2012-08-29 ENCOUNTER — Inpatient Hospital Stay (HOSPITAL_COMMUNITY): Payer: Worker's Compensation

## 2012-08-29 DIAGNOSIS — W19XXXA Unspecified fall, initial encounter: Secondary | ICD-10-CM

## 2012-08-29 DIAGNOSIS — S82872A Displaced pilon fracture of left tibia, initial encounter for closed fracture: Secondary | ICD-10-CM

## 2012-08-29 DIAGNOSIS — F17200 Nicotine dependence, unspecified, uncomplicated: Secondary | ICD-10-CM | POA: Diagnosis present

## 2012-08-29 LAB — BASIC METABOLIC PANEL
BUN: 13 mg/dL (ref 6–23)
CO2: 24 mEq/L (ref 19–32)
Chloride: 104 mEq/L (ref 96–112)
Creatinine, Ser: 0.84 mg/dL (ref 0.50–1.35)
Glucose, Bld: 172 mg/dL — ABNORMAL HIGH (ref 70–99)

## 2012-08-29 LAB — CBC
HCT: 34.1 % — ABNORMAL LOW (ref 39.0–52.0)
MCH: 32.7 pg (ref 26.0–34.0)
MCHC: 35.8 g/dL (ref 30.0–36.0)
MCV: 91.4 fL (ref 78.0–100.0)
RDW: 13 % (ref 11.5–15.5)
WBC: 12.1 10*3/uL — ABNORMAL HIGH (ref 4.0–10.5)

## 2012-08-29 MED ORDER — OXYCODONE-ACETAMINOPHEN 5-325 MG PO TABS: 1.0000 | ORAL_TABLET | ORAL | Status: DC | PRN

## 2012-08-29 MED ORDER — DOCUSATE SODIUM 100 MG PO CAPS
100.0000 mg | ORAL_CAPSULE | Freq: Two times a day (BID) | ORAL | Status: DC
  Administered 2012-08-29 – 2012-08-31 (×4): 100 mg via ORAL
  Filled 2012-08-29 (×7): qty 1

## 2012-08-29 MED ORDER — METOCLOPRAMIDE HCL 5 MG/ML IJ SOLN
5.0000 mg | Freq: Three times a day (TID) | INTRAMUSCULAR | Status: DC | PRN
  Administered 2012-08-29: 10 mg via INTRAVENOUS
  Filled 2012-08-29: qty 2

## 2012-08-29 MED ORDER — METHOCARBAMOL 100 MG/ML IJ SOLN
1000.0000 mg | Freq: Four times a day (QID) | INTRAVENOUS | Status: DC
  Administered 2012-08-30: 1000 mg via INTRAVENOUS
  Filled 2012-08-29: qty 10

## 2012-08-29 MED ORDER — FENTANYL CITRATE 0.05 MG/ML IJ SOLN
50.0000 ug | INTRAMUSCULAR | Status: DC | PRN
  Administered 2012-08-29 – 2012-08-31 (×10): 50 ug via INTRAVENOUS
  Filled 2012-08-29 (×10): qty 2

## 2012-08-29 MED ORDER — ONDANSETRON HCL 4 MG/2ML IJ SOLN: 4.0000 mg | Freq: Four times a day (QID) | INTRAMUSCULAR | Status: DC | PRN

## 2012-08-29 MED ORDER — METHOCARBAMOL 500 MG PO TABS
1000.0000 mg | ORAL_TABLET | Freq: Four times a day (QID) | ORAL | Status: DC
  Administered 2012-08-29 – 2012-08-31 (×9): 1000 mg via ORAL
  Filled 2012-08-29 (×18): qty 2

## 2012-08-29 MED ORDER — OXYCODONE HCL 5 MG PO TABS
5.0000 mg | ORAL_TABLET | ORAL | Status: DC | PRN
  Administered 2012-08-29 – 2012-08-30 (×4): 10 mg via ORAL
  Filled 2012-08-29 (×4): qty 2

## 2012-08-29 MED ORDER — POLYETHYLENE GLYCOL 3350 17 G PO PACK: 17.0000 g | PACK | Freq: Every day | ORAL | Status: DC | PRN

## 2012-08-29 MED ORDER — OXYCODONE-ACETAMINOPHEN 5-325 MG PO TABS
1.0000 | ORAL_TABLET | Freq: Four times a day (QID) | ORAL | Status: DC | PRN
  Administered 2012-08-29 – 2012-08-31 (×8): 2 via ORAL
  Filled 2012-08-29 (×7): qty 2

## 2012-08-29 MED ORDER — DIPHENHYDRAMINE HCL 12.5 MG/5ML PO ELIX
12.5000 mg | ORAL_SOLUTION | ORAL | Status: DC | PRN
  Administered 2012-08-30 – 2012-08-31 (×5): 25 mg via ORAL
  Filled 2012-08-29 (×5): qty 10

## 2012-08-29 MED ORDER — TRAMADOL HCL 50 MG PO TABS: 50.0000 mg | ORAL_TABLET | Freq: Four times a day (QID) | ORAL | Status: DC | PRN

## 2012-08-29 MED ORDER — METOCLOPRAMIDE HCL 10 MG PO TABS
5.0000 mg | ORAL_TABLET | Freq: Three times a day (TID) | ORAL | Status: DC | PRN
Start: 2012-08-29 — End: 2012-08-31

## 2012-08-29 MED ORDER — VANCOMYCIN HCL IN DEXTROSE 1-5 GM/200ML-% IV SOLN
1000.0000 mg | Freq: Two times a day (BID) | INTRAVENOUS | Status: AC
  Administered 2012-08-29: 1000 mg via INTRAVENOUS
  Filled 2012-08-29: qty 200

## 2012-08-29 MED ORDER — ENOXAPARIN SODIUM 40 MG/0.4ML ~~LOC~~ SOLN
40.0000 mg | SUBCUTANEOUS | Status: DC
  Administered 2012-08-29 – 2012-08-31 (×2): 40 mg via SUBCUTANEOUS
  Filled 2012-08-29 (×3): qty 0.4

## 2012-08-29 MED ORDER — ONDANSETRON HCL 4 MG PO TABS: 4.0000 mg | ORAL_TABLET | Freq: Four times a day (QID) | ORAL | Status: DC | PRN

## 2012-08-29 NOTE — Progress Notes (Signed)
Orthopedic Tech Progress Note Patient Details:  Ryan Santiago [birth year] [id number]  Patient ID: Nona Dell, male   DOB: Mar 09, 1981, 32 y.o.   MRN: [id number] Trapeze bar patient helper  Nikki Dom 08/29/2012, 1:54 PM

## 2012-08-29 NOTE — Progress Notes (Signed)
[facility] (OTS)  Subjective: 1 Day Post-Op Procedure(s) (LRB): EXTERNAL FIXATION Left Tibia fuibula fracture (Left) RELEASE DORSAL COMPARTMENT (DEQUERVAIN) (Left) APPLICATION OF WOUND VAC (Left) Patient reports pain as moderate.   Up well with PT today, platform walker added. Wants oxygen off.  Objective: Current Vitals Blood pressure 131/63, pulse 66, temperature 98.9 F (37.2 C), temperature source Oral, resp. rate 18, weight 200 lb (90.719 kg), SpO2 99.00%. Vital signs in last 24 hours: Temp:  [98 F (36.7 C)-99.2 F (37.3 C)] 98.9 F (37.2 C) (04/27 0628) Pulse Rate:  [52-90] 66 (04/27 0628) Resp:  [8-24] 18 (04/27 0823) BP: (130-144)/(63-118) 131/63 mmHg (04/27 0628) SpO2:  [90 %-100 %] 99 % (04/27 0823) Weight:  [200 lb (90.719 kg)] 200 lb (90.719 kg) (04/26 1658)  Intake/Output from previous day: 04/26 0701 - 04/27 0700 In: 4610 [P.O.:960; I.V.:3650] Out: 1300 [Urine:1200; Blood:100]  LABS  Recent Labs  08/28/12 1710 08/28/12 1740 08/29/12 0640  HGB 14.2 13.9 12.2*    Recent Labs  08/28/12 1710 08/28/12 1740 08/29/12 0640  WBC 17.8*  --  12.1*  RBC 4.24  --  3.73*  HCT 39.5 41.0 34.1*  PLT 216  --  185    Recent Labs  08/28/12 1740 08/29/12 0640  NA 144 139  K 4.0 3.9  CL 110 104  CO2  --  24  BUN 17 13  CREATININE 1.10 0.84  GLUCOSE 118* 172*  CALCIUM  --  8.8    Recent Labs  08/28/12 1710  INR 0.94    Physical Exam  A&O, pain much better controlled LLE vacs with minimal drainage, Sens DPN, SPN though not symmetric, but decreased TN on plantar surface  Motor unable to actively move toes, no pain with passive stretch  DP 2+   Imaging Ct Abdomen Pelvis Wo Contrast  08/29/2012  *RADIOLOGY REPORT*  Clinical Data: The patient fell 10 feet off of a ladder.  CT ABDOMEN AND PELVIS WITHOUT CONTRAST  Technique:  Multidetector CT imaging of the abdomen and pelvis was performed following the standard protocol without  intravenous contrast.  Comparison: None.  Findings: Atelectasis or consolidation in the lung bases with small bilateral pleural effusions.  The study is technically limited due to lack of IV contrast material.  This limits evaluation of the solid organs for post- traumatic changes.  As visualized, the unenhanced appearance of the liver, spleen, gallbladder, pancreas, adrenal glands, kidneys, abdominal aorta, inferior vena cava, retroperitoneal lymph nodes, stomach, small bowel, and colon are unremarkable.  No free air or free fluid in the abdomen.  No abdominal wall defects.  Pelvis:  Prostate gland is not enlarged.  No bladder wall thickening.  No evidence of diverticulitis.  The appendix is normal.  No free or loculated pelvic fluid collections.  No significant pelvic lymphadenopathy.  Normal alignment of the lumbar vertebrae.  No vertebral compression deformities.  Sacrum, pelvis, and hips appear intact.  IMPRESSION: No acute post-traumatic changes demonstrated on unenhanced imaging of the abdomen or pelvis.  Bilateral pleural effusions with atelectasis or consolidation in both lung bases.   Original Report Authenticated By: Burman Nieves, M.D.    Dg Elbow Complete Left  08/28/2012  *RADIOLOGY REPORT*  Clinical Data: Left elbow pain following injury.  LEFT ELBOW - COMPLETE 3+ VIEW  Comparison: None  Findings: A nondisplaced radial head fracture is present. A joint effusion is noted. No other fracture is identified. There is no evidence of subluxation or dislocation. No focal bony lesions are  noted.  IMPRESSION: Radial head fracture with joint effusion.   Original Report Authenticated By: Harmon Pier, M.D.    Dg Knee 1-2 Views Left  08/28/2012  *RADIOLOGY REPORT*  Clinical Data: Left knee injury and pain.  LEFT KNEE - 1-2 VIEW  Comparison: None  Findings: No evidence of acute fracture, subluxation or dislocation identified.  No joint effusion noted.  No radio-opaque foreign bodies are present.  No focal  bony lesions are noted.  The joint spaces are unremarkable.  IMPRESSION: Unremarkable left knee.   Original Report Authenticated By: Harmon Pier, M.D.    Dg Tibia/fibula Left  08/28/2012  *RADIOLOGY REPORT*  Clinical Data: Fall off ladder with lower leg pain.  LEFT TIBIA AND FIBULA - 2 VIEW  Comparison: None  Findings: Tibiotalar dislocation is identified. Fractures of the distal fibula, medial malleolus, lateral distal tibia and lateral process of the talus are present. No proximal fibular or tibial fractures are present. The knee is unremarkable.  IMPRESSION: Tibiotalar dislocation with ankle fractures as described.   Original Report Authenticated By: Harmon Pier, M.D.    Dg Ankle 2 Views Left  08/28/2012  *RADIOLOGY REPORT*  Clinical Data: Intraoperative imaging from left ankle fixation  DG C-ARM 1-60 MIN - NRPT [facility],LEFT ANKLE - 2 VIEW  Comparison: Previous images same date  Findings: Intraoperative images demonstrate apparent relocation of the tibiotalar joint.  Distal tibial and fibular fractures are again noted with fracture fragments in near anatomic alignment.  IMPRESSION: Apparent relocation of the tibiotalar joint, with persistent visualization of the distal tibial and fibular fractures.   Original Report Authenticated By: Christiana Pellant, M.D.    Dg Ankle 2 Views Left  08/28/2012  *RADIOLOGY REPORT*  Clinical Data: Fall off ladder with left ankle injury, pain and swelling.  LEFT ANKLE - 2 VIEW  Comparison: None  Findings:   Anterior lateral dislocation of the tibiotalar joint noted. A comminuted fracture and angulated fracture of the distal fibular diaphysis is present. A medial malleolar fracture is identified with 2 cm lateral displacement. Displaced fracture fragments from the lateral distal tibia and anterior articular surface are noted. A few tiny fracture fragments from the lateral process of the talus noted.  IMPRESSION: Tibiotalar dislocation with ankle fractures as described.   Original  Report Authenticated By: Harmon Pier, M.D.    Dg Chest Portable 1 View  08/28/2012  *RADIOLOGY REPORT*  Clinical Data: . Preop evaluation  PORTABLE CHEST - 1 VIEW  Comparison: 10/24/2003  Findings: Hypoventilation.  Decreased lung volumes with bibasilar atelectasis.  Negative for heart failure or effusion.  IMPRESSION: Hypoventilation with bibasilar atelectasis.  Decreased lung volume.   Original Report Authenticated By: Janeece Riggers, M.D.    Dg Ankle Left Port  08/28/2012  *RADIOLOGY REPORT*  Clinical Data: Fracture dislocations at the left ankle.  PORTABLE LEFT ANKLE - 2 VIEW  Comparison: Radiographs dated 08/28/2012  Findings: The patient has undergone closed reduction.  External fixation has been applied. The complex fractures of the distal tibia and the distal fibular shaft fracture demonstrate better alignment and position.  There is still abnormal widening of the distal tibiofibular joint and the medial malleolus is slightly displaced and fragmented.  The dislocation at the tibial talar joint appears to be reduced.  IMPRESSION: External fixation applied.  Alignment and position of the fracture fragments has improved.  Dislocation has been reduced.   Original Report Authenticated By: Francene Boyers, M.D.    Dg C-arm 1-60 Min-no Report  08/28/2012  *RADIOLOGY REPORT*  Clinical Data: Intraoperative imaging from left ankle fixation  DG C-ARM 1-60 MIN - [facility],LEFT ANKLE - 2 VIEW  Comparison: Previous images same date  Findings: Intraoperative images demonstrate apparent relocation of the tibiotalar joint.  Distal tibial and fibular fractures are again noted with fracture fragments in near anatomic alignment.  IMPRESSION: Apparent relocation of the tibiotalar joint, with persistent visualization of the distal tibial and fibular fractures.   Original Report Authenticated By: Christiana Pellant, M.D.     Assessment/Plan: 1 Day Post-Op  CT scan of L knee negative, abd negative, foot with 4th metatarsal  fracture; no displacement or malalignment of radial head   1. To OR tomorrow for wound closure 2. WBAT thru L elbow in platform, non-op treatment radial head 3. D/c anticipated Tue or Wed depending on pain  4. D/c pca now  Myrene Galas, MD [facility] [phone] (p)  08/29/2012, 9:49 AM

## 2012-08-29 NOTE — Progress Notes (Signed)
Wasted 30 mls fentanyl IV with Wiliam Ke RN

## 2012-08-29 NOTE — Progress Notes (Signed)
Physical Therapy Evaluation Patient Details Name: Ryan Santiago MRN: [id number] DOB: 12-18-1980 Today's Date: 08/29/2012 Time: 4098-1191 PT Time Calculation (min): 36 min  PT Assessment / Plan / Recommendation Clinical Impression  32 yo male admitted post fall from roof resulting in [place] fx/dislocation and L radial head fx; s/p fixation with fasciotomies secondary to compartment syndrome; Present to PT with decr functional mobility; Will benefit form acute PT to maximize independence and safety with mobility, and facilitate safe dc home    PT Assessment  Patient needs continued PT services    Follow Up Recommendations  Home health PT;Supervision - Intermittent    Does the patient have the potential to tolerate intense rehabilitation      Barriers to Discharge None planning to put in ramp    Equipment Recommendations  Other (comment);Rolling walker with 5" wheels;Wheelchair (measurements PT) (L platform RW; WC with elevating legrests)    Recommendations for Other Services     Frequency Min 6X/week    Precautions / Restrictions Precautions Precautions: Fall (slight fall risk due to multiple lines) Restrictions Weight Bearing Restrictions: Yes LUE Weight Bearing: Weight bear through elbow only (per Dr. Carola Frost, ok for platform) LLE Weight Bearing: Non weight bearing   Pertinent Vitals/Pain 8/10 pain; PCA used      Mobility  Bed Mobility Bed Mobility: Supine to Sit;Sitting - Scoot to Edge of Bed Supine to Sit: 4: Min assist;With rails;HOB elevated Sitting - Scoot to [place] of Bed: 4: Min assist Details for Bed Mobility Assistance: Min assist require to support LLE; otherwise, pt moves quite well Transfers Transfers: Sit to Stand;Stand to Sit Sit to Stand: 4: Min assist;From bed Stand to Sit: 4: Min assist;To chair/3-in-1;With armrests Details for Transfer Assistance: Cues for technique and physical assist for LLE support Ambulation/Gait Ambulation/Gait Assistance: 4:  Min assist Ambulation Distance (Feet): 2 Feet (pivot "hop" steps bed to chair) Assistive device: Other (Comment) (with UE support on bedrail and cahir armrests) Ambulation/Gait Assistance Details: Tolerated pivot steps on RLE quite well with good maintenance of NWBing LLE    Exercises     PT Diagnosis: Difficulty walking;Acute pain  PT Problem List: Decreased activity tolerance;Decreased balance;Decreased mobility;Decreased knowledge of use of DME;Decreased knowledge of precautions;Pain PT Treatment Interventions: DME instruction;Gait training;Stair training;Functional mobility training;Therapeutic activities;Therapeutic exercise;Patient/family education;Wheelchair mobility training   PT Goals Acute Rehab PT Goals PT Goal Formulation: With patient Time For Goal Achievement: 09/05/12 Potential to Achieve Goals: Good Pt will go Supine/Side to Sit: with modified independence PT Goal: Supine/Side to Sit - Progress: Goal set today Pt will go Sit to Supine/Side: with modified independence PT Goal: Sit to Supine/Side - Progress: Goal set today Pt will go Sit to Stand: with modified independence PT Goal: Sit to Stand - Progress: Goal set today Pt will go Stand to Sit: with modified independence PT Goal: Stand to Sit - Progress: Goal set today Pt will Transfer Bed to Chair/Chair to Bed: with modified independence PT Transfer Goal: Bed to Chair/Chair to Bed - Progress: Goal set today Pt will Ambulate: 51 - 150 feet;with modified independence;with rolling walker (L platform RW) PT Goal: Ambulate - Progress: Goal set today Pt will Go Up / Down Stairs: 3-5 stairs;with min assist;with rolling walker (L platfrom RW) PT Goal: Up/Down Stairs - Progress: Goal set today  Visit Information  Last PT Received On: 08/29/12 Assistance Needed: +1 (+2 can be helpful for lines)    Subjective Data  Subjective: Agreeable to getting up and OOB, despite pain  (  Pt's wife voiced displeasure at getting pt up so  early POD#1, which is understandable; when this therapist offered to come back, the pt opted to continue with PT eval/mobilizing, so we proceeded) Patient Stated Goal: home   Prior Functioning  Home Living Lives With: Spouse (and 5 daughters (youngest is 1 yo)) Available Help at Discharge: Family;Available 24 hours/day Type of Home: Mobile home Home Access: Stairs to enter (wife reports they will have ramp installed tomorrow) Secretary/administrator of Steps: 4 Entrance Stairs-Rails: Right;Left;Can reach both Home Layout: One level Bathroom Shower/Tub: Engineer, manufacturing systems: Standard Bathroom Accessibility: Yes Home Adaptive Equipment: None Prior Function Level of Independence: Independent Able to Take Stairs?: Yes Driving: Yes Vocation: Full time employment Comments: Chief Executive Officer Communication: No difficulties Dominant Hand: Right    Cognition  Cognition Arousal/Alertness: Awake/alert Behavior During Therapy: WFL for tasks assessed/performed Overall Cognitive Status: Within Functional Limits for tasks assessed    Extremity/Trunk Assessment Right Upper Extremity Assessment RUE ROM/Strength/Tone: Within functional levels Left Upper Extremity Assessment LUE ROM/Strength/Tone: Deficits LUE ROM/Strength/Tone Deficits: Minimized use of LUE until cleared by MD Right Lower Extremity Assessment RLE ROM/Strength/Tone: Within functional levels Left Lower Extremity Assessment LLE ROM/Strength/Tone: Deficits LLE ROM/Strength/Tone Deficits: NWBing with external fixator; Able to perform straight leg raise against gravity; positive active toe flex/extend LLE Sensation: Deficits LLE Sensation Deficits: decr light touch, but able to  feel pressure Trunk Assessment Trunk Assessment: Normal   Balance    End of Session PT - End of Session Equipment Utilized During Treatment: Gait belt Activity Tolerance: Patient tolerated treatment well (despite pain) Patient left: in  chair;with call bell/phone within reach Nurse Communication: Mobility status  GP     Olen Pel [place], [place] [phone]  08/29/2012, 10:15 AM

## 2012-08-30 ENCOUNTER — Encounter (HOSPITAL_COMMUNITY)
Admission: EM | Disposition: A | Discharge status: Home Health | Payer: Self-pay | Source: Home / Self Care | Attending: Orthopedic Surgery

## 2012-08-30 ENCOUNTER — Encounter (HOSPITAL_COMMUNITY): Payer: Self-pay | Admitting: Anesthesiology

## 2012-08-30 ENCOUNTER — Encounter (HOSPITAL_COMMUNITY): Payer: Self-pay | Admitting: *Deleted

## 2012-08-30 ENCOUNTER — Inpatient Hospital Stay (HOSPITAL_COMMUNITY): Payer: Worker's Compensation | Admitting: Anesthesiology

## 2012-08-30 HISTORY — PX: FASCIOTOMY: SHX132

## 2012-08-30 LAB — BASIC METABOLIC PANEL
BUN: 13 mg/dL (ref 6–23)
CO2: 28 mEq/L (ref 19–32)
Chloride: 106 mEq/L (ref 96–112)
Glucose, Bld: 104 mg/dL — ABNORMAL HIGH (ref 70–99)
Potassium: 3.5 mEq/L (ref 3.5–5.1)
Sodium: 141 mEq/L (ref 135–145)

## 2012-08-30 LAB — CBC
HCT: 32.2 % — ABNORMAL LOW (ref 39.0–52.0)
Hemoglobin: 11.3 g/dL — ABNORMAL LOW (ref 13.0–17.0)
RBC: 3.46 MIL/uL — ABNORMAL LOW (ref 4.22–5.81)
RDW: 12.9 % (ref 11.5–15.5)
WBC: 13 10*3/uL — ABNORMAL HIGH (ref 4.0–10.5)

## 2012-08-30 LAB — PROTIME-INR: INR: 0.96 (ref 0.00–1.49)

## 2012-08-30 LAB — PLATELET FUNCTION ASSAY: Collagen / Epinephrine: 148 seconds (ref 0–184)

## 2012-08-30 LAB — APTT: aPTT: 30 seconds (ref 24–37)

## 2012-08-30 LAB — SURGICAL PCR SCREEN: Staphylococcus aureus: NEGATIVE

## 2012-08-30 SURGERY — FASCIOTOMY, UPPER EXTREMITY
Anesthesia: General | Site: Leg Lower | Laterality: Left | Wound class: Clean Contaminated

## 2012-08-30 MED ORDER — MEPERIDINE HCL 25 MG/ML IJ SOLN
INTRAMUSCULAR | Status: AC
  Filled 2012-08-30: qty 1

## 2012-08-30 MED ORDER — PROPOFOL 10 MG/ML IV BOLUS
INTRAVENOUS | Status: DC | PRN
  Administered 2012-08-30: 200 mg via INTRAVENOUS

## 2012-08-30 MED ORDER — LACTATED RINGERS IV SOLN
INTRAVENOUS | Status: DC
  Administered 2012-08-30: 14:00:00 via INTRAVENOUS

## 2012-08-30 MED ORDER — VANCOMYCIN HCL IN DEXTROSE 1-5 GM/200ML-% IV SOLN
1000.0000 mg | Freq: Two times a day (BID) | INTRAVENOUS | Status: DC
  Administered 2012-08-30 – 2012-08-31 (×3): 1000 mg via INTRAVENOUS
  Filled 2012-08-30 (×4): qty 200

## 2012-08-30 MED ORDER — OXYCODONE HCL 5 MG PO TABS
5.0000 mg | ORAL_TABLET | ORAL | Status: DC | PRN
Start: 2012-08-30 — End: 2012-08-31
  Administered 2012-08-30 – 2012-08-31 (×8): 15 mg via ORAL
  Filled 2012-08-30 (×8): qty 3

## 2012-08-30 MED ORDER — FENTANYL CITRATE 0.05 MG/ML IJ SOLN
INTRAMUSCULAR | Status: DC | PRN
  Administered 2012-08-30: 100 ug via INTRAVENOUS
  Administered 2012-08-30: 25 ug via INTRAVENOUS

## 2012-08-30 MED ORDER — LIDOCAINE HCL (CARDIAC) 20 MG/ML IV SOLN
INTRAVENOUS | Status: DC | PRN
  Administered 2012-08-30: 60 mg via INTRAVENOUS

## 2012-08-30 MED ORDER — HYDROMORPHONE HCL PF 1 MG/ML IJ SOLN
INTRAMUSCULAR | Status: AC
  Filled 2012-08-30: qty 1

## 2012-08-30 MED ORDER — 0.9 % SODIUM CHLORIDE (POUR BTL) OPTIME
TOPICAL | Status: DC | PRN
  Administered 2012-08-30: 1000 mL

## 2012-08-30 MED ORDER — ARTIFICIAL TEARS OP OINT
TOPICAL_OINTMENT | OPHTHALMIC | Status: DC | PRN
  Administered 2012-08-30: 1 via OPHTHALMIC

## 2012-08-30 MED ORDER — METHOCARBAMOL 500 MG PO TABS
ORAL_TABLET | ORAL | Status: AC
  Filled 2012-08-30: qty 2

## 2012-08-30 MED ORDER — PROMETHAZINE HCL 25 MG/ML IJ SOLN: 6.2500 mg | INTRAMUSCULAR | Status: DC | PRN

## 2012-08-30 MED ORDER — MEPERIDINE HCL 25 MG/ML IJ SOLN
6.2500 mg | INTRAMUSCULAR | Status: DC | PRN
  Administered 2012-08-30: 12.5 mg via INTRAVENOUS

## 2012-08-30 MED ORDER — LACTATED RINGERS IV SOLN
INTRAVENOUS | Status: DC | PRN
  Administered 2012-08-30: 13:00:00 via INTRAVENOUS

## 2012-08-30 MED ORDER — POTASSIUM CHLORIDE IN NACL 20-0.9 MEQ/L-% IV SOLN
INTRAVENOUS | Status: DC
  Administered 2012-08-30: 17:00:00 via INTRAVENOUS
  Filled 2012-08-30 (×2): qty 1000

## 2012-08-30 MED ORDER — TRAMADOL HCL 50 MG PO TABS
50.0000 mg | ORAL_TABLET | Freq: Four times a day (QID) | ORAL | Status: DC
  Administered 2012-08-30 – 2012-08-31 (×5): 50 mg via ORAL
  Filled 2012-08-30 (×5): qty 1

## 2012-08-30 MED ORDER — HYDROMORPHONE HCL PF 1 MG/ML IJ SOLN
0.2500 mg | INTRAMUSCULAR | Status: DC | PRN
  Administered 2012-08-30 (×2): 0.25 mg via INTRAVENOUS
  Administered 2012-08-30: 0.5 mg via INTRAVENOUS

## 2012-08-30 MED ORDER — OXYCODONE-ACETAMINOPHEN 5-325 MG PO TABS
ORAL_TABLET | ORAL | Status: AC
  Filled 2012-08-30: qty 2

## 2012-08-30 SURGICAL SUPPLY — 58 items
BANDAGE ELASTIC 3 VELCRO ST LF (GAUZE/BANDAGES/DRESSINGS) ×2 IMPLANT
BANDAGE ELASTIC 4 VELCRO ST LF (GAUZE/BANDAGES/DRESSINGS) ×4 IMPLANT
BANDAGE ELASTIC 6 VELCRO ST LF (GAUZE/BANDAGES/DRESSINGS) IMPLANT
BANDAGE GAUZE ELAST BULKY 4 IN (GAUZE/BANDAGES/DRESSINGS) ×6 IMPLANT
BNDG COHESIVE 4X5 TAN STRL (GAUZE/BANDAGES/DRESSINGS) IMPLANT
BNDG ELASTIC 2 VLCR STRL LF (GAUZE/BANDAGES/DRESSINGS) ×2 IMPLANT
BRUSH SCRUB DISP (MISCELLANEOUS) ×4 IMPLANT
CANISTER WOUND CARE 500ML ATS (WOUND CARE) IMPLANT
CLOTH BEACON ORANGE TIMEOUT ST (SAFETY) ×2 IMPLANT
COVER SURGICAL LIGHT HANDLE (MISCELLANEOUS) ×4 IMPLANT
CUFF TOURNIQUET SINGLE 24IN (TOURNIQUET CUFF) IMPLANT
CUFF TOURNIQUET SINGLE 34IN LL (TOURNIQUET CUFF) IMPLANT
DRAPE C-ARMOR (DRAPES) IMPLANT
DRAPE INCISE IOBAN 66X45 STRL (DRAPES) IMPLANT
DRAPE ORTHO SPLIT 77X108 STRL (DRAPES) ×2
DRAPE SURG ORHT 6 SPLT 77X108 (DRAPES) ×2 IMPLANT
DRAPE U-SHAPE 47X51 STRL (DRAPES) ×2 IMPLANT
DRSG ADAPTIC 3X8 NADH LF (GAUZE/BANDAGES/DRESSINGS) IMPLANT
DRSG MEPITEL 4X7.2 (GAUZE/BANDAGES/DRESSINGS) ×4 IMPLANT
DRSG PAD ABDOMINAL 8X10 ST (GAUZE/BANDAGES/DRESSINGS) IMPLANT
DRSG VAC ATS LRG SENSATRAC (GAUZE/BANDAGES/DRESSINGS) IMPLANT
DRSG VAC ATS MED SENSATRAC (GAUZE/BANDAGES/DRESSINGS) IMPLANT
DRSG VAC ATS SM SENSATRAC (GAUZE/BANDAGES/DRESSINGS) IMPLANT
ELECT REM PT RETURN 9FT ADLT (ELECTROSURGICAL) ×2
ELECTRODE REM PT RTRN 9FT ADLT (ELECTROSURGICAL) ×1 IMPLANT
GLOVE BIO SURGEON STRL SZ7 (GLOVE) ×4 IMPLANT
GLOVE BIO SURGEON STRL SZ7.5 (GLOVE) ×4 IMPLANT
GLOVE BIO SURGEON STRL SZ8 (GLOVE) ×2 IMPLANT
GLOVE BIOGEL PI IND STRL 7.0 (GLOVE) ×1 IMPLANT
GLOVE BIOGEL PI IND STRL 7.5 (GLOVE) ×1 IMPLANT
GLOVE BIOGEL PI IND STRL 8 (GLOVE) ×1 IMPLANT
GLOVE BIOGEL PI INDICATOR 7.0 (GLOVE) ×1
GLOVE BIOGEL PI INDICATOR 7.5 (GLOVE) ×1
GLOVE BIOGEL PI INDICATOR 8 (GLOVE) ×1
GOWN PREVENTION PLUS XLARGE (GOWN DISPOSABLE) ×2 IMPLANT
GOWN STRL NON-REIN LRG LVL3 (GOWN DISPOSABLE) ×2 IMPLANT
GOWN STRL REIN XL XLG (GOWN DISPOSABLE) ×4 IMPLANT
KIT BASIN OR (CUSTOM PROCEDURE TRAY) ×2 IMPLANT
KIT ROOM TURNOVER OR (KITS) ×2 IMPLANT
MANIFOLD NEPTUNE II (INSTRUMENTS) ×2 IMPLANT
NS IRRIG 1000ML POUR BTL (IV SOLUTION) ×2 IMPLANT
PACK GENERAL/GYN (CUSTOM PROCEDURE TRAY) ×2 IMPLANT
PAD ARMBOARD 7.5X6 YLW CONV (MISCELLANEOUS) IMPLANT
SET MONITOR QUICK PRESSURE (MISCELLANEOUS) IMPLANT
SPONGE GAUZE 4X4 12PLY (GAUZE/BANDAGES/DRESSINGS) ×2 IMPLANT
SPONGE LAP 18X18 X RAY DECT (DISPOSABLE) ×2 IMPLANT
STAPLER VISISTAT 35W (STAPLE) IMPLANT
STOCKINETTE IMPERVIOUS 9X36 MD (GAUZE/BANDAGES/DRESSINGS) ×2 IMPLANT
SUT ETHILON 2 0 FS 18 (SUTURE) ×10 IMPLANT
SUT ETHILON 2 0 FSLX (SUTURE) IMPLANT
SUT VIC AB 0 CTB1 27 (SUTURE) IMPLANT
SUT VIC AB 2-0 CT1 27 (SUTURE) ×3
SUT VIC AB 2-0 CT1 TAPERPNT 27 (SUTURE) ×3 IMPLANT
SUT VIC AB 2-0 CT3 27 (SUTURE) IMPLANT
SUT VIC AB 2-0 CTB1 (SUTURE) IMPLANT
TOWEL OR 17X24 6PK STRL BLUE (TOWEL DISPOSABLE) ×2 IMPLANT
TOWEL OR 17X26 10 PK STRL BLUE (TOWEL DISPOSABLE) ×4 IMPLANT
WATER STERILE IRR 1000ML POUR (IV SOLUTION) ×2 IMPLANT

## 2012-08-30 NOTE — Progress Notes (Signed)
PT Cancellation Note  Patient Details Name: Ryan Santiago MRN: [id number] DOB: [birth year]   Cancelled Treatment:    Reason Eval/Treat Not Completed: Medical issues which prohibited therapy . Pt to OR for surgery today. Pt c/o pain 9.5/10; states his L LE fell off bed while he was sleeping last night and hit the ground. Plan to see pt in morning prior to D/C, pt and his significant other are concerned about his mobility at home and if a wheelchair will fit in their home.    Donnamarie Poag [place], [place] [phone] 08/30/2012, 10:47 AM

## 2012-08-30 NOTE — Transfer of Care (Signed)
Immediate Anesthesia Transfer of Care Note  Patient: Ryan Santiago  Procedure(s) Performed: Procedure(s): FASCIOTOMY CLOSURE LEFT LEG (Left)  Patient Location: PACU  Anesthesia Type:General  Level of Consciousness: awake, alert  and oriented  Airway & Oxygen Therapy: Patient Spontanous Breathing and Patient connected to nasal cannula oxygen  Post-op Assessment: Report given to PACU RN  Post vital signs: Reviewed and stable  Complications: No apparent anesthesia complications

## 2012-08-30 NOTE — Brief Op Note (Signed)
08/28/2012 - 08/30/2012  2:57 PM  PATIENT:  Nona Dell  32 y.o. male  PRE-OPERATIVE DIAGNOSIS:  S/P FASCIOTOMY LEFT LEG  POST-OPERATIVE DIAGNOSIS:  S/P FASCIOTOMY LEFT LEG  PROCEDURE:  Procedure(s): FASCIOTOMY CLOSURE LEFT LEG (Left) 32cm  SURGEON:  Surgeon(s) and Role:    * Budd Palmer, MD - Primary  PHYSICIAN ASSISTANT: Montez Morita, [facility]  ANESTHESIA:   general  EBL:  Total I/O In: 600 [I.V.:600] Out: 300 [Urine:300]  BLOOD ADMINISTERED:none  DRAINS: none   LOCAL MEDICATIONS USED:  NONE  SPECIMEN:  No Specimen  DISPOSITION OF SPECIMEN:  N/A  COUNTS:  YES  TOURNIQUET:  * No tourniquets in log *  DICTATION: .Other Dictation: Dictation Number [id number]  PLAN OF CARE: Admit to inpatient   PATIENT DISPOSITION:  PACU - hemodynamically stable.   Delay start of Pharmacological VTE agent (>24hrs) due to surgical blood loss or risk of bleeding: no

## 2012-08-30 NOTE — Anesthesia Postprocedure Evaluation (Signed)
  Anesthesia Post-op Note  Patient: Ryan Santiago  Procedure(s) Performed: Procedure(s): FASCIOTOMY CLOSURE LEFT LEG (Left)  Patient Location: PACU  Anesthesia Type:General  Level of Consciousness: awake, alert , oriented and patient cooperative  Airway and Oxygen Therapy: Patient Spontanous Breathing  Post-op Pain: mild  Post-op Assessment: Post-op Vital signs reviewed, Patient's Cardiovascular Status Stable, Respiratory Function Stable, Patent Airway, No signs of Nausea or vomiting and Pain level controlled  Post-op Vital Signs: stable  Complications: No apparent anesthesia complications

## 2012-08-30 NOTE — Preoperative (Signed)
Beta Blockers   Reason not to administer Beta Blockers:Not Applicable 

## 2012-08-30 NOTE — Anesthesia Procedure Notes (Signed)
Procedure Name: LMA Insertion Date/Time: 08/30/2012 1:42 PM Performed by: Carmela Rima Pre-anesthesia Checklist: Patient identified, Timeout performed, Suction available, Emergency Drugs available and Patient being monitored Patient Re-evaluated:Patient Re-evaluated prior to inductionOxygen Delivery Method: Circle system utilized Preoxygenation: Pre-oxygenation with 100% oxygen Intubation Type: IV induction Ventilation: Mask ventilation without difficulty LMA: LMA inserted LMA Size: 4.0 Tube type: Oral Number of attempts: 1 Placement Confirmation: positive ETCO2 and breath sounds checked- equal and bilateral Tube secured with: Tape Dental Injury: Teeth and Oropharynx as per pre-operative assessment

## 2012-08-30 NOTE — Progress Notes (Signed)
Orthopaedic Trauma Service Progress Note     2 Days Post-Op  Subjective   Pt doing ok this am C/o heel pain L leg Still with altered sensorium to L foot  Denies cp or sob   Informed by pt after surgery that he has von willebrand's  Objective  BP 134/92  Pulse 73  Temp(Src) 97.4 F (36.3 C) (Oral)  Resp 18  Wt 90.719 kg (200 lb)  SpO2 98%  Patient Vitals for the past 24 hrs:  BP Temp Pulse Resp SpO2  08/30/12 0629 134/92 mmHg 97.4 F (36.3 C) 73 18 98 %  08/29/12 2102 142/74 mmHg 98.7 F (37.1 C) 83 18 99 %  08/29/12 1726 126/70 mmHg - 76 20 95 %  08/29/12 1422 136/66 mmHg 98.2 F (36.8 C) 84 18 97 %    Intake/Output     04/27 0701 - 04/28 0700 04/28 0701 - 04/29 0700   P.O. 1120    I.V. (mL/kg) 200 (2.2)    Total Intake(mL/kg) 1320 (14.6)    Urine (mL/kg/hr) 1550 (0.7)    Drains 350 (0.2)    Blood     Total Output 1900     Net -580          Urine Occurrence 4 x      Labs Results for Ryan Santiago, Ryan Santiago (MRN [id number]) as of 08/30/2012 08:46  Ref. Range 08/30/2012 07:28  Sodium Latest Range: 135-145 mEq/L 141  Potassium Latest Range: 3.5-5.1 mEq/L 3.5  Chloride Latest Range: 96-112 mEq/L 106  CO2 Latest Range: 19-32 mEq/L 28  BUN Latest Range: 6-23 mg/dL 13  Creatinine Latest Range: 0.50-1.35 mg/dL 4.09  Calcium Latest Range: 8.4-10.5 mg/dL 8.6  GFR calc non Af Amer Latest Range: >90 mL/min >90  GFR calc Af Amer Latest Range: >90 mL/min >90  Glucose Latest Range: 70-99 mg/dL 811 (H)    Exam  Gen: awake and alert, NAD Lungs: Clear Cardiac: reg, s1 and s2 Abd: + BS, NT Ext:      Right Upper Extremity  Dressing stable  + swelling  Motor and sensory functions intact  + Radial pulse            Left Lower Extremity  Ex fix stable  vacs functioning   No discernable EHL, FHL  No lesser to motion  Dec sensation along DPN, SPN and TN  + DP pulse  Ext warm   Assessment and Plan  2 Days Post-Op  32 y/o white male s/p fall off ladder  1.  Fall off ladder 2. Complex Left pilon fracture, tibia and fibula s/p ex fix and 4-compartment fasciotomies  OR today for closure of fasciotomies  NWB   PT/OT  Ice and elevate  3. R radial head and neck fx, nondisplaced  WBAT thru elbow  Use platform walker  ROM as tolerated   No pushing, pulling activiites with extended elbow   3. Von Willebrands  Will check labs today including coags  This may pose some issues with timely healing  4. Pain management:  Titrate oral meds  5. DVT/PE prophylaxis:  Lovenox daily  Will d/c home on lovenox as well 6. ID:   Vancomycin- post op prophylaxis and open fasciotomies  7. Activity:  OOB as tolerated  NWB L leg  WBAT thru L elbow   8.Ex-fix/Splint care:  Daily pin care starting 09/01/2012  Will change dressings in OR today   9. Dispo:  OR this afternoon for closure of fasciotomies L leg  Possible  d/c home tomorrow or day after    Mearl Latin, PA-C [facility] [phone] (P) 08/30/2012 8:41 AM

## 2012-08-30 NOTE — Progress Notes (Signed)
OT Cancellation Note  Patient Details Name: Ryan Santiago MRN: [id number] DOB: 1980-07-30   Cancelled Treatment:    Reason Eval/Treat Not Completed: Medical issues which prohibited therapy Pt to OR for surgery today. Pt c/o pain 9.5/10; states his L LE fell off bed while he was sleeping last night and hit the ground. Plan to see pt in morning prior to D/C.   Earlie Raveling OTR/L [phone] 08/30/2012, 11:29 AM

## 2012-08-30 NOTE — Op Note (Signed)
NAME:  EZZARD, DITMER NO.:  [id number]  MEDICAL RECORD NO.:  [id number]  LOCATION:  5N12C                        FACILITY:  [facility]  PHYSICIAN:  Doralee Albino. Carola Frost, M.D. DATE OF BIRTH:  07-19-1980  DATE OF PROCEDURE:  08/28/2012 DATE OF DISCHARGE:                              OPERATIVE REPORT   PREOPERATIVE DIAGNOSES: 1. Left P-line fracture dislocation. 2. Possible compartment syndrome.  POSTOPERATIVE DIAGNOSES: 1. Left P-line fracture dislocation. 2. Compartment syndrome.  PROCEDURES: 1. Closed reduction of left ankle dislocation. 2. Closed manipulation and reduction of tibial P-line fracture, tibia,     and fibula. 3. Application of spanning external fixator. 4. Four compartment fasciotomies in the left leg. 5. Application of large wound VAC 70 cm2. 6. Measurement of compartment pressures x4.  SURGEON:  Doralee Albino. Carola Frost, M.D.  ASSISTANT:  Mearl Latin, PA-C  ANESTHESIA:  General.  COMPLICATIONS:  None.  IV FLUIDS:  2500 mL crystalloid.  LOCAL:  None.  SPECIMENS:  None.  FINDINGS:  Compartment pressure anterior 44, lateral 36, superficial posterior compartment 37, deep posterior compartment 79.  DISPOSITION:  To PACU.  CONDITION:  Stable.  BRIEF SUMMARY AND INDICATION FOR PROCEDURE:  Tomoya Ringwald is a 32 year old male who fell approximately 10 feet from a ladder, landing on the last run.  He sustained a left radial head and neck fracture in addition to his left ankle dislocation.  He was seen and evaluated at the [facility], where he had uncontrolled pain and improvement in his alignment after 2 reductions, but no complete reduction was possible.  Pain continued to be quite severe and his narcotic requirements quite high. He also had associated decrease in sensation and paresthesias.  I discussed with him and his wife and brother the potential for compartment syndrome and a need for emergent release as well as spanning external  fixation, to attempt to achieve reduction with the need for delayed repair.  We also discussed postoperative CT scan of both the elbow and ankle.  They understood the risks to include pin tract infection, nerve injury, vessel injury, failure to prevent skin blistering and wound breakdown that could ultimately to deep infection and amputation, DVT, PE, need for further surgery, and many others. They did wish to proceed.  BRIEF SUMMARY OF PROCEDURE:  Mr. Bonawitz received preoperative antibiotics consisting of g of vanc, was taken to the operating room where general anesthesia was induced.  His left lower extremity was prepped and draped in usual sterile fashion.  No tourniquet was used during the procedure.  I began by establishing a 2 tibial pins, and then a transcalcaneal pin in proper alignment with the articular surface of the talus.  This then enabled me to pull longitudinal traction, with series of bumps and use of actually 2 assistants including Montez Morita, PA-C who assisted me throughout to work to achieve a reduction.  We began with simply controlling the shaft and applying posterior force to get the tibiotalar joint reduced.  We then focused on the P-line fracture including both the tibia and fibular components.  It is exceedingly difficult to control the alignment and the P-line fracture continued and want to shift laterally  with the talus wanting to go into the syndesmotic interval.  We were ultimately successful in generating acceptable alignment of the talus such that it was beneath the tibia excepting a small amount of translation and then AP, lateral, and mortise views confirmed acceptable reduction as well.  Metatarsal pins were placed in the first and firth, and the fore and midfoot brought into plantigrade position.  The attention was then turned to the leg.  The Stryker interstitial compartment measuring device was then used to check the anterior compartment, lateral  compartment, and posterior superficial and posterior deep compartments with the results noted above.  This did meet the threshold for fasciotomy and a 4 compartment fasciotomy was performed after release of the superficial posterior. The gastrocsoleus complex was retracted out of the way exposing the deep membrane along its insertion to the posterior tibia and this was released showing hemorrhagic muscle, but all of which was contract.  It did protrude somewhat forcefully upon initial release of the fascia. Wound VAC was then applied medially and laterally.  Kerlix dressings for the pins and then Ace wrap from foot to thigh.  A large bulky dressing was placed on the left elbow with the elbow at 90 degrees of flexion. Sling would then be applied.  The patient was awakened from anesthesia and transported to the PACU in stable condition.  Montez Morita, PA-C again did assist me throughout.  PROGNOSIS:  Mr. Chicoine will need to return to the OR for closure of his fasciotomies and possibly a surgical treatment of his left radial head fracture should this be indicated by the findings on the CAT scan.  More than likely this will be treated nonsurgically and after an adequate period of soft tissue resolution, he can return for repair of his P-line fracture.  He is at increased risk for DVT arthritis which is a substantial increase in rest because of the high magnitude injury to the articular surface.     Doralee Albino. Carola Frost, M.D.     MHH/MEDQ  D:  08/28/2012  T:  08/30/2012  Job:  [id number]

## 2012-08-30 NOTE — Anesthesia Preprocedure Evaluation (Addendum)
Anesthesia Evaluation  Patient identified by MRN, date of birth, ID band Patient awake    Reviewed: Allergy & Precautions, H&P , NPO status , Patient's Chart, lab work & pertinent test results  Airway Mallampati: I TM Distance: >3 FB Neck ROM: Full    Dental  (+) Teeth Intact and Dental Advisory Given   Pulmonary neg pulmonary ROS,  breath sounds clear to auscultation        Cardiovascular negative cardio ROS  Rhythm:Regular Rate:Normal     Neuro/Psych    GI/Hepatic   Endo/Other    Renal/GU      Musculoskeletal   Abdominal (+)  Abdomen: soft.    Peds  Hematology  (+) Blood dyscrasia, , Von willebrands   Anesthesia Other Findings Von Willebrand's Disease  Reproductive/Obstetrics                        Anesthesia Physical Anesthesia Plan  ASA: I  Anesthesia Plan: General LMA   Post-op Pain Management:    Induction: Intravenous  Airway Management Planned: LMA  Additional Equipment:   Intra-op Plan:   Post-operative Plan: Extubation in OR  Informed Consent:   Dental advisory given and Dental Advisory Given  Plan Discussed with: CRNA, Surgeon and Anesthesiologist  Anesthesia Plan Comments:       Anesthesia Quick Evaluation

## 2012-08-31 ENCOUNTER — Encounter (HOSPITAL_COMMUNITY): Payer: Self-pay | Admitting: Orthopedic Surgery

## 2012-08-31 DIAGNOSIS — S92302A Fracture of unspecified metatarsal bone(s), left foot, initial encounter for closed fracture: Secondary | ICD-10-CM

## 2012-08-31 DIAGNOSIS — T79A29A Traumatic compartment syndrome of unspecified lower extremity, initial encounter: Secondary | ICD-10-CM

## 2012-08-31 DIAGNOSIS — D680 Von Willebrand's disease: Secondary | ICD-10-CM | POA: Diagnosis present

## 2012-08-31 LAB — URINALYSIS, ROUTINE W REFLEX MICROSCOPIC
Ketones, ur: NEGATIVE mg/dL
Leukocytes, UA: NEGATIVE
Nitrite: NEGATIVE
Protein, ur: NEGATIVE mg/dL
Urobilinogen, UA: 0.2 mg/dL (ref 0.0–1.0)

## 2012-08-31 LAB — BASIC METABOLIC PANEL
Calcium: 8.6 mg/dL (ref 8.4–10.5)
Creatinine, Ser: 1.02 mg/dL (ref 0.50–1.35)
GFR calc Af Amer: >90 mL/min (ref >90)

## 2012-08-31 LAB — CBC
MCHC: 35.1 g/dL (ref 30.0–36.0)
RDW: 12.7 % (ref 11.5–15.5)

## 2012-08-31 MED ORDER — OXYCODONE-ACETAMINOPHEN 5-325 MG PO TABS: 1.0000 | ORAL_TABLET | Freq: Four times a day (QID) | ORAL | Status: DC | PRN

## 2012-08-31 MED ORDER — OXYCODONE HCL 5 MG PO TABS: 5.0000 mg | ORAL_TABLET | Freq: Four times a day (QID) | ORAL | Status: DC | PRN

## 2012-08-31 MED ORDER — POLYETHYLENE GLYCOL 3350 17 G PO PACK: 17.0000 g | PACK | Freq: Every day | ORAL | Status: DC | PRN

## 2012-08-31 MED ORDER — ENOXAPARIN SODIUM 40 MG/0.4ML ~~LOC~~ SOLN: 40.0000 mg | SUBCUTANEOUS | Status: DC

## 2012-08-31 MED ORDER — DSS 100 MG PO CAPS: 100.0000 mg | ORAL_CAPSULE | Freq: Two times a day (BID) | ORAL | Status: DC | PRN

## 2012-08-31 MED ORDER — METHOCARBAMOL 500 MG PO TABS: 500.0000 mg | ORAL_TABLET | Freq: Four times a day (QID) | ORAL | Status: DC

## 2012-08-31 MED ORDER — PREGABALIN 75 MG PO CAPS: 75.0000 mg | ORAL_CAPSULE | Freq: Two times a day (BID) | ORAL | Status: DC

## 2012-08-31 NOTE — Care Management Note (Signed)
CARE MANAGEMENT NOTE 08/31/2012  Patient:  Ryan Santiago, Ryan Santiago   Account Number:  [id number]  Date Initiated:  08/31/2012  Documentation initiated by:  Vance Peper  Subjective/Objective Assessment:   32 yr old male s/p left tibia faciotomy and left elbow fracture.     Action/Plan:   CM spoke with patient. He is under worker's comp.   Anticipated DC Date:  08/31/2012   Anticipated DC Plan:  HOME/SELF CARE      DC Planning Services  CM consult      PAC Choice  DURABLE MEDICAL EQUIPMENT   Choice offered to / List presented to:     DME arranged  WALKER - PLATFORM  WHEELCHAIR - MANUAL      DME agency  OTHER - SEE NOTE        Status of service:  In process, will continue to follow Medicare Important Message given?   (If response is "NO", the following Medicare IM given date fields will be blank) Date Medicare IM given:   Date Additional Medicare IM given:    Discharge Disposition:  HOME/SELF CARE  Per UR Regulation:    If discussed at Long Length of Stay Meetings, dates discussed:    Comments:  08/31/12  1500 Vance Peper, RN BSN NCM CM spoke with patient's wwife to obtain worker's comp adjuster's contact information. Called [place]. patient's claim# [id number]. Instructed to contact [facility] [phone]. Called and spoke with Victorino Dike concerning platform walker and wheelchair for patient. Faxed orders to her [phone] .

## 2012-08-31 NOTE — Op Note (Signed)
NAME:  FAVOR, HACKLER NO.:  [id number]  MEDICAL RECORD NO.:  [id number]  LOCATION:  5N12C                        FACILITY:  [facility]  PHYSICIAN:  Doralee Albino. Carola Frost, M.D. DATE OF BIRTH:  May 14, 1980  DATE OF PROCEDURE:  08/30/2012 DATE OF DISCHARGE:                              OPERATIVE REPORT   PREOPERATIVE DIAGNOSIS:  Open medial and lateral left leg fasciotomies.  POSTOPERATIVE DIAGNOSIS:  Open medial and lateral left leg fasciotomies.  PROCEDURE:  Layered closure of medial and lateral fasciotomies, left leg 32 cm.  SURGEON:  Doralee Albino. Carola Frost, M.D.  ASSISTANT:  Mearl Latin, [place].  ANESTHESIA:  General.  COMPLICATIONS:  None.  TOURNIQUET:  None.  ESTIMATED BLOOD LOSS:  Minimal.  600 mL of IV crystalloid.  URINARY OUTPUT:  300 mL.  DISPOSITION:  To PACU.  CONDITION:  Stable.  BRIEF SUMMARY OF INDICATION FOR PROCEDURE:  Korvin Valentine is a 32 year old male status post severe fracture dislocation of the ankle as well as a left nondisplaced radial head and neck fracture.  I did discuss with the patient the risks and benefits of return to the OR for closure of his fasciotomies, the potential for partial closure and need for further surgery down the line for definitive internal fixation, plating of the articular surface.  The patient understood the risk associated including infection, nerve injury, vessel injury, need for further surgery, and many others and did wish to proceed.  BRIEF DESCRIPTION OF PROCEDURE:  The patient was given preoperative antibiotics, taken to the operating room where general anesthesia was induced.  His left lower extremity was prepped and draped in usual sterile fashion.  Node adjustment was made to the external fixator. Wounds were irrigated thoroughly.  Bovie used to check muscle viability in each compartment, which generated brisk contractile response with no areas of demarcation or necrosis, and no debridement was  required. Following a thorough irrigation on both the medial and lateral sides, the wound was reapproximated using 2-0 Vicryl and 3-0 nylon.  Sterile gently compressive dressing and then Ace wraps were applied.  The patient was taken to the PACU in stable condition.  Montez Morita, PA-C did assist me throughout the procedure as we worked simultaneously to expedite his surgical care.  PROGNOSIS:  Mr. Bouillon will be discharged as soon as his pain and mobility allow.  He will be weightbearing as tolerated in a platform walker on the left with nonweightbearing to the left lower extremity. We plan to see him in the office next week to reassess the soft tissues with surgical planning determined by the soft tissue swelling resolution.  He is certainly at increased risk for arthritis and loss of motion.  His reconstruction will be difficult and the risks and complications is significant as well, including thromboembolic disease, infection, wound breakdown, symptomatic hardware, and others.  He will be on DVT prophylaxis with Lovenox.  We have discussed with the pharmacist the report of von Willebrand's disease.     Doralee Albino. Carola Frost, M.D.     MHH/MEDQ  D:  08/30/2012  T:  08/31/2012  Job:  [id number]

## 2012-08-31 NOTE — Evaluation (Signed)
Occupational Therapy Evaluation Patient Details Name: Ryan Santiago MRN: [id number] DOB: 16-Nov-1980 Today's Date: 08/31/2012 Time: 4098-1191 OT Time Calculation (min): 36 min  OT Assessment / Plan / Recommendation Clinical Impression    32 yo male admitted post fall from roof resulting in [place] fx/dislocation and L radial head fx; s/p fixation with fasciotomies secondary to compartment syndrome. Pt demo increased impulsiveness today. Disregards instructions on safe transfer techniques. Pt at overall Max A level for LB ADLs. Will have wife home 24/7 for assistance. Pt anticipating D/C home today. Recommend HHOT upon acute D/C.    OT Assessment  Patient needs continued OT Services    Follow Up Recommendations  Home health OT;Supervision/Assistance - 24 hour    Barriers to Discharge      Equipment Recommendations  3 in 1 bedside comode    Recommendations for Other Services    Frequency  Min 2X/week    Precautions / Restrictions Precautions Precautions: Fall Restrictions Weight Bearing Restrictions: Yes LUE Weight Bearing: Weight bear through elbow only LLE Weight Bearing: Non weight bearing   Pertinent Vitals/Pain 8/10 pain. PCA used.     ADL  Eating/Feeding: Independent Where Assessed - Eating/Feeding: Bed level Grooming: Set up Where Assessed - Grooming: Supine, head of bed up Upper Body Bathing: Set up Where Assessed - Upper Body Bathing: Unsupported sitting Lower Body Bathing: Maximal assistance Where Assessed - Lower Body Bathing: Supported sit to stand Upper Body Dressing: Set up Where Assessed - Upper Body Dressing: Unsupported sitting Lower Body Dressing: Maximal assistance Where Assessed - Lower Body Dressing: Supported sit to stand Toilet Transfer: Minimal assistance Toilet Transfer Method: Sit to Barista: Comfort height toilet;Grab bars Toileting - Architect and Hygiene: Moderate assistance Where Assessed - Toileting  Clothing Manipulation and Hygiene: Sit to stand from 3-in-1 or toilet Tub/Shower Transfer Method: Not assessed Equipment Used: Gait belt;Other (comment) (platform walker, attempted crutch) ADL Comments: Pt at overall Max A level for LB ADLs. Will have wife home to assist him. Educated wife and showed her 3 in 1 versus tub bench to place in bathtub at home.  Cues for pt not to weightbear through LUE. OT elevated UE and encouraged gentle ROM of LUE.    OT Diagnosis: Acute pain  OT Problem List: Decreased activity tolerance;Impaired balance (sitting and/or standing);Decreased safety awareness;Decreased knowledge of precautions;Decreased knowledge of use of DME or AE;Pain OT Treatment Interventions: Self-care/ADL training   OT Goals Acute Rehab OT Goals OT Goal Formulation: With patient/family Time For Goal Achievement: 09/07/12 Potential to Achieve Goals: Good ADL Goals Pt Will Perform Lower Body Bathing: with min assist;Sit to stand from chair ADL Goal: Lower Body Bathing - Progress: Goal set today Pt Will Perform Lower Body Dressing: with min assist;Sit to stand from bed;Sit to stand from chair ADL Goal: Lower Body Dressing - Progress: Goal set today Pt Will Transfer to Toilet: with min assist;Ambulation;with DME ADL Goal: Toilet Transfer - Progress: Goal set today Pt Will Perform Toileting - Clothing Manipulation: with supervision;Standing ADL Goal: Toileting - Clothing Manipulation - Progress: Goal set today Pt Will Perform Toileting - Hygiene: Sit to stand from 3-in-1/toilet;Leaning right and/or left on 3-in-1/toilet;with supervision ADL Goal: Toileting - Hygiene - Progress: Goal set today Pt Will Perform Tub/Shower Transfer: Tub transfer;Ambulation;with DME ADL Goal: Tub/Shower Transfer - Progress: Goal set today  Visit Information  Last OT Received On: 08/31/12 Assistance Needed: +1 PT/OT Co-Evaluation/Treatment: Yes    Subjective Data      Prior Functioning  Home  Living Lives With: Spouse (5 daughters, youngest is 2 y.o.) Available Help at Discharge: Family;Available 24 hours/day Type of Home: Mobile home Home Access: Ramped entrance Entrance Stairs-Number of Steps: 4 Entrance Stairs-Rails: Right;Left;Can reach both Home Layout: One level Bathroom Shower/Tub: Engineer, manufacturing systems: Standard Bathroom Accessibility: Yes Home Adaptive Equipment: None Prior Function Level of Independence: Independent Able to Take Stairs?: Yes Driving: Yes Vocation: Full time employment Communication Communication: No difficulties Dominant Hand: Right         Vision/Perception     Cognition  Cognition Arousal/Alertness: Awake/alert Behavior During Therapy: Impulsive Overall Cognitive Status: Within Functional Limits for tasks assessed Area of Impairment: Safety/judgement Safety/Judgement: Decreased awareness of safety General Comments: Pt requiring multiple cues not to weightbear through hand/wrist of LUE. Pt impulsive     Extremity/Trunk Assessment Right Upper Extremity Assessment RUE ROM/Strength/Tone: Within functional levels Left Upper Extremity Assessment LUE ROM/Strength/Tone: Deficits;Due to precautions     Mobility Bed Mobility Bed Mobility: Supine to Sit;Sitting - Scoot to [place] of Bed;Sit to Supine Supine to Sit: With rails;6: Modified independent (Device/Increase time) Sitting - Scoot to Edge of Bed: With rail;6: Modified independent (Device/Increase time) Transfers Transfers: Sit to Stand;Stand to Sit Sit to Stand: 4: Min assist;With upper extremity assist;From bed Stand to Sit: With upper extremity assist;4: Min assist;To bed Details for Transfer Assistance: pt very impulsive; required vc's for hand placement and safety; unsteady with intial standing secondary to decreased balance; pt required vc's to not WB through L UE for sit to stand            End of Session OT - End of Session Equipment Utilized During Treatment:  Gait belt Activity Tolerance: Patient limited by pain Patient left: in bed;with call bell/phone within reach;with family/visitor present Nurse Communication: Other (comment) (wrapping of LUE)  GO     Earlie Raveling OTR/L [phone] 08/31/2012, 1:37 PM

## 2012-08-31 NOTE — Progress Notes (Signed)
Physical Therapy Treatment Patient Details Name: Ryan Santiago MRN: [id number] DOB: 21-May-1980 Today's Date: 08/31/2012 Time: 1020-1058 PT Time Calculation (min): 38 min  PT Assessment / Plan / Recommendation Comments on Treatment Session  Pt demo increased impulsiveness today. Disregards instructions on safe transfer techniques. Agreed to attempt bariatric platform RW in order to give him enough width to maintain NWB status on L LE. Pt anticipating D/C home today. Recommend HHPT upon acute D/C. Will need W/C for main source of mobility.     Follow Up Recommendations  Home health PT;Supervision - Intermittent     Does the patient have the potential to tolerate intense rehabilitation     Barriers to Discharge        Equipment Recommendations  Other (comment);Rolling walker with 5" wheels;Wheelchair (measurements PT)    Recommendations for Other Services    Frequency Min 5X/week   Plan Discharge plan remains appropriate;Frequency remains appropriate    Precautions / Restrictions Precautions Precautions: Fall Restrictions Weight Bearing Restrictions: Yes LUE Weight Bearing: Weight bear through elbow only LLE Weight Bearing: Non weight bearing   Pertinent Vitals/Pain 8/10; pt premedicated.     Mobility  Bed Mobility Bed Mobility: Supine to Sit;Sitting - Scoot to [place] of Bed;Sit to Supine Supine to Sit: With rails;6: Modified independent (Device/Increase time) Sitting - Scoot to Edge of Bed: With rail;6: Modified independent (Device/Increase time) Transfers Sit to Stand: 4: Min assist;With upper extremity assist;From bed Stand to Sit: With upper extremity assist;4: Min assist;To bed Details for Transfer Assistance: pt very impulsive; required vc's for hand placement and safety; unsteady with intial standing secondary to decreased balance; pt required vc's to not WB through L UE for sit to stand  Ambulation/Gait Ambulation/Gait Assistance: 4: Min assist Ambulation Distance  (Feet): 4 Feet (x2) Assistive device: Other (Comment);Left platform walker;Crutches (pt unhappy with either AD; agreeable on bariatric platform ) Ambulation/Gait Assistance Details: pt demo difficulty keeping L ORIF equipment off RW or off floor; pt demo balance deficits and is impulsive with gt; he required vc's for sequencing; c/o pain in L LE with amb; attempted amb to bathroom x2; unable to do so secondary to pain in L LE and his unhappiness with AD Gait Pattern: Step-to pattern;Trunk flexed Gait velocity: decreased Stairs: No (Landlord has built ramp ) Naval architect Mobility: No    Exercises     PT Diagnosis:    PT Problem List:   PT Treatment Interventions:     PT Goals Acute Rehab PT Goals PT Goal Formulation: With patient Time For Goal Achievement: 09/05/12 PT Goal: Supine/Side to Sit - Progress: Progressing toward goal PT Goal: Sit to Supine/Side - Progress: Progressing toward goal PT Goal: Sit to Stand - Progress: Progressing toward goal PT Goal: Stand to Sit - Progress: Progressing toward goal PT Transfer Goal: Bed to Chair/Chair to Bed - Progress: Progressing toward goal PT Goal: Ambulate - Progress: Progressing toward goal PT Goal: Up/Down Stairs - Progress: Discontinued (comment) (now has ramp at home )  Visit Information  Last PT Received On: 08/31/12 Assistance Needed: +1 PT/OT Co-Evaluation/Treatment: Yes    Subjective Data  Subjective: "I am ready to go home today. Lets do what we got to do" Patient Stated Goal: home   Cognition  Cognition Arousal/Alertness: Awake/alert Behavior During Therapy: Impulsive Overall Cognitive Status: Within Functional Limits for tasks assessed    Balance  Balance Balance Assessed: Yes Static Standing Balance Static Standing - Balance Support: Right upper extremity supported;During functional activity Static Standing -  Level of Assistance: 4: Min assist  End of Session PT - End of Session Equipment  Utilized During Treatment: Gait belt Activity Tolerance: Patient limited by pain Patient left: in bed;with call bell/phone within reach;with family/visitor present;Other (comment) (OT present ) Nurse Communication: Mobility status;Other (comment) (Bariatric platform RW for D/C )   GP     [name], [place] [phone] 08/31/2012, 1:15 PM

## 2012-08-31 NOTE — Progress Notes (Signed)
Discussed discharge instructions with patient and wife. All questions answered. Some dressing supplies given to patient for first day dressing change. Changed only the ACE wrap dressing so that wife could understand how to wrap patient LLE. Changed one pin site that was soiled to demonstrate how to change pin sites. Patient wheeled down to family car by RN. Patient sat in back seat with leg elevated. Patient also given pain medication and a muscle relaxer before discharge.

## 2012-08-31 NOTE — Progress Notes (Signed)
[facility] Progress Note     1 Day Post-Op  Subjective   Doing much better Pain controlled Reports shooting pain in L leg Tolerating diet + void No CP, No SOB No abd pain    Objective  BP 140/80  Pulse 88  Temp(Src) 98.2 F (36.8 C) (Oral)  Resp 16  Wt 90.719 kg (200 lb)  SpO2 95%  Patient Vitals for the past 24 hrs:  BP Temp Temp src Pulse Resp SpO2  08/31/12 0603 140/80 mmHg 98.2 F (36.8 C) - 88 16 95 %  08/31/12 0057 125/72 mmHg 99.7 F (37.6 C) - 97 18 96 %  08/30/12 2126 159/104 mmHg 98.3 F (36.8 C) - 99 16 98 %  08/30/12 1600 140/87 mmHg 99.5 F (37.5 C) Oral 78 16 98 %  08/30/12 1545 149/87 mmHg 98 F (36.7 C) - 70 16 97 %  08/30/12 1530 136/85 mmHg - - 73 12 96 %  08/30/12 1515 145/82 mmHg - - 86 12 97 %  08/30/12 1500 126/70 mmHg - - 67 12 95 %  08/30/12 1445 - 98.1 F (36.7 C) - 81 - -  08/30/12 1256 144/99 mmHg 98.4 F (36.9 C) - 83 18 95 %    Intake/Output     04/28 0701 - 04/29 0700 04/29 0701 - 04/30 0700   P.O. 360    I.V. (mL/kg) 1200 (13.2)    Total Intake(mL/kg) 1560 (17.2)    Urine (mL/kg/hr) 1250 (0.6)    Drains     Total Output 1250     Net +310            Labs Results for YSMAEL, HIRES (MRN [id number]) as of 08/31/2012 12:50  Ref. Range 08/31/2012 05:35 08/31/2012 09:10  Sodium Latest Range: 135-145 mEq/L 136   Potassium Latest Range: 3.5-5.1 mEq/L 3.6   Chloride Latest Range: 96-112 mEq/L 99   CO2 Latest Range: 19-32 mEq/L 28   BUN Latest Range: 6-23 mg/dL 11   Creatinine Latest Range: 0.50-1.35 mg/dL 0.86   Calcium Latest Range: 8.4-10.5 mg/dL 8.6   GFR calc non Af Amer Latest Range: >90 mL/min >90   GFR calc Af Amer Latest Range: >90 mL/min >90   Glucose Latest Range: 70-99 mg/dL 98   WBC Latest Range: 4.0-10.5 K/uL  12.2 (H)  RBC Latest Range: 4.22-5.81 MIL/uL  3.61 (L)  Hemoglobin Latest Range: 13.0-17.0 g/dL  57.8 (L)  HCT Latest Range: 39.0-52.0 %  33.6 (L)  MCV Latest Range: 78.0-100.0 fL  93.1   MCH Latest Range: 26.0-34.0 pg  32.7  MCHC Latest Range: 30.0-36.0 g/dL  46.9  RDW Latest Range: 11.5-15.5 %  12.7  Platelets Latest Range: 150-400 K/uL  196    Exam  Gen: Awake and alert, NAD, Pt smoking e-cig on entrance to room today Lungs: clear B Cardiac: reg s1 and s2 Abd: + BS, NT Ext:            Left Lower Extremity  Ex fix stable  pinsites stable  Calcaneal pins with drainage, expected  Faint toe motion appreciated  TN sensation > DPN and SPN  Ext warm  + DP pulse   Assessment and Plan  1 Day Post-Op 32 y/o white male s/p fall off ladder  1. Fall off ladder 2. Complex Left pilon fracture, tibia and fibula s/p ex fix and 4-compartment fasciotomies POD 1              NWB  Ice and elevate  Dressing change on Thursday at home  Plan for return to OR in 2 weeks  3. R radial head and neck fx, nondisplaced             WBAT thru elbow             Use platform walker             ROM as tolerated               No pushing, pulling activiites with extended elbow              3. Von W[place]brands            Appears to be stable  No excessive bleeding noted  4. Pain management:             Titrate oral meds  5. DVT/PE prophylaxis:             Lovenox daily             Will d/c home on lovenox as well 6. ID:              completed course of Vanc  7. Activity:             OOB as tolerated             NWB L leg             WBAT thru L elbow   8.Ex-fix/Splint care:             Daily pin care-reviewed pin care and wound care with wife              9. Impediments to fracture and soft tissue healing  Nicotine use  Immobility/NWB  10. Dispo:            d/c home today  Follow up on Monday 09/06/2012   Mearl Latin, PA-C [facility] [phone] (P) 08/31/2012 12:50 PM

## 2012-08-31 NOTE — Discharge Summary (Signed)
Orthopaedic Trauma Service (OTS)  Patient ID: Ryan Santiago MRN: [id number] DOB/AGE: [birth year] 32 y.o.  Admit date: 08/28/2012 Discharge date: 08/31/2012  Admission Diagnoses: Fall off ladder Closed left pilon fracture, tibia and fibula  nicotine dependence Acute compartment syndrome left leg  Discharge Diagnoses:  Active Problems:   Closed pilon fracture of left tibia   Fall   Nicotine dependence   Von Willebrand disease   Compartment syndrome of lower extremity, traumatic left leg   Procedures Performed: 08/28/2012- Dr. Carola Frost 1. Closed reduction of left ankle dislocation.  2. Closed manipulation and reduction of tibial P-line fracture, tibia,  and fibula.  3. Application of spanning external fixator.  4. Four compartment fasciotomies in the left leg.  5. Application of large wound VAC 70 cm2.  6. Measurement of compartment pressures x4.  08/30/2012- Dr. Carola Frost Layered closure of medial and lateral fasciotomies, left leg  32 cm.   Discharged Condition: good  Hospital Course:   Patient is a 32 year old white male who is admitted to the hospital on 08/28/2012 after he sustained a fall while at work off of a ladder. Patient landed on the bottom rung of a ladder resulting in injury to his left ankle. Patient was initially brought to [facility] long hospital. Attempt at closed reduction were attempted however there was gross instability and reduction could not be maintained. As such it was deemed that the patient would need to go to the OR for external fixation of his left ankle to stabilize his injury. There are also concerns for impending compartment syndrome thus making an expeditious trip to the OR necessary. Unfortunately there was no OR time available in a reasonable timeframe at [facility] long hospital thus the patient was transferred to [place] to the OR area to undergo surgery. The patient was taken to the OR on 08/28/2012 where application of a external fixator was  applied and 4 compartment fasciotomies were performed to the left leg. Patient was then transferred to the orthopedic floor for continued observation, pain control and to begin therapies. Patient tolerated the procedure well he did have some pain on postoperative day #1 and 2. He did work with physical therapy on these days as well. No acute issues were noted. Patient was requiring IV pain medication to get adequate control. On patient was tolerating diet well and voiding on his own power. On 08/30/2012 we returned to the operating room for layered closure of his fasciotomies. In addition patient was covered with vancomycin postoperatively from procedure #1 until 24 hours after procedure #2 for antibiotic prophylaxis given his open wounds. On postoperative day #1 following the second procedure patient was having adequate pain relief with oral pain medication and desired to discharge to home. All necessary home materials have been provided to the patient. He did have a ramp built at home to facilitate entrance into his home as well. Patient was tolerating diet, voiding well and mobilizing on his own and transferring without significant difficulty and was deemed to be stable for discharge to home.  We did review proper wound care with the patient and his wife prior to discharge as well including a proper care of his external fixator.  We did obtain a CT scan of his left foot and ankle after application of his external fixator. The CT scan showed a severely comminuted left distal tibia fracture with extensive articular involvement. Also fracture of the left fibula was noted as well. Additionally there is also fracture of the  base of the fourth metatarsal noted. Additionally the posterior tibialis tendon was also noted to be trapped within the fracture fragments as well  Patient was covered with Lovenox for DVT and PE prophylaxis from admission until discharge and will continue on Lovenox after  discharge.  Consults: None  Significant Diagnostic Studies: labs:  Results for ARATH, KAIGLER (MRN [id number]) as of 08/31/2012 12:50  Ref. Range 08/31/2012 05:35 08/31/2012 09:10  Sodium Latest Range: 135-145 mEq/L 136   Potassium Latest Range: 3.5-5.1 mEq/L 3.6   Chloride Latest Range: 96-112 mEq/L 99   CO2 Latest Range: 19-32 mEq/L 28   BUN Latest Range: 6-23 mg/dL 11   Creatinine Latest Range: 0.50-1.35 mg/dL 4.09   Calcium Latest Range: 8.4-10.5 mg/dL 8.6   GFR calc non Af Amer Latest Range: >90 mL/min >90   GFR calc Af Amer Latest Range: >90 mL/min >90   Glucose Latest Range: 70-99 mg/dL 98   WBC Latest Range: 4.0-10.5 K/uL  12.2 (H)  RBC Latest Range: 4.22-5.81 MIL/uL  3.61 (L)  Hemoglobin Latest Range: 13.0-17.0 g/dL  81.1 (L)  HCT Latest Range: 39.0-52.0 %  33.6 (L)  MCV Latest Range: 78.0-100.0 fL  93.1  MCH Latest Range: 26.0-34.0 pg  32.7  MCHC Latest Range: 30.0-36.0 g/dL  91.4  RDW Latest Range: 11.5-15.5 %  12.7  Platelets Latest Range: 150-400 K/uL  196     CT scan left foot and ankle  A complex comminuted left intra-articular distal tibia fracture. Involvement of the left fibula as well. The base of the fourth metatarsal fracture. Entrapment of posterior tibialis tendon  Treatments: IV hydration, antibiotics: vancomycin, analgesia: Fentanyl, oxycodone, Percocet, IV Tylenol, anticoagulation: LMW heparin, therapies: PT, OT and RN and surgery: As above   Discharge Exam:  Orthopaedic Trauma Service Progress Note                                          1 Day Post-Op  Subjective   Doing much better Pain controlled Reports shooting pain in L leg Tolerating diet + void No CP, No SOB No abd pain    Objective  BP 140/80  Pulse 88  Temp(Src) 98.2 F (36.8 C) (Oral)  Resp 16  Wt 90.719 kg (200 lb)  SpO2 95%  Patient Vitals for the past 24 hrs:   BP  Temp  Temp src  Pulse  Resp  SpO2   08/31/12 0603  140/80 mmHg  98.2 F (36.8 C)  -  88  16  95 %    08/31/12 0057  125/72 mmHg  99.7 F (37.6 C)  -  97  18  96 %   08/30/12 2126  159/104 mmHg  98.3 F (36.8 C)  -  99  16  98 %   08/30/12 1600  140/87 mmHg  99.5 F (37.5 C)  Oral  78  16  98 %   08/30/12 1545  149/87 mmHg  98 F (36.7 C)  -  70  16  97 %   08/30/12 1530  136/85 mmHg  -  -  73  12  96 %   08/30/12 1515  145/82 mmHg  -  -  86  12  97 %   08/30/12 1500  126/70 mmHg  -  -  67  12  95 %   08/30/12 1445  -  98.1 F (36.7 C)  -  81  -  -   08/30/12 1256  144/99 mmHg  98.4 F (36.9 C)  -  83  18  95 %     Intake/Output     04/28 0701 - 04/29 0700 04/29 0701 - 04/30 0700    P.O. 360     I.V. (mL/kg) 1200 (13.2)     Total Intake(mL/kg) 1560 (17.2)     Urine (mL/kg/hr) 1250 (0.6)     Drains      Total Output 1250      Net +310              Labs Results for LEE, KUANG (MRN [id number]) as of 08/31/2012 12:50   Ref. Range  08/31/2012 05:35  08/31/2012 09:10   Sodium  Latest Range: 135-145 mEq/L  136     Potassium  Latest Range: 3.5-5.1 mEq/L  3.6     Chloride  Latest Range: 96-112 mEq/L  99     CO2  Latest Range: 19-32 mEq/L  28     BUN  Latest Range: 6-23 mg/dL  11     Creatinine  Latest Range: 0.50-1.35 mg/dL  4.09     Calcium  Latest Range: 8.4-10.5 mg/dL  8.6     GFR calc non Af Amer  Latest Range: >90 mL/min  >90     GFR calc Af Amer  Latest Range: >90 mL/min  >90     Glucose  Latest Range: 70-99 mg/dL  98     WBC  Latest Range: 4.0-10.5 K/uL    12.2 (H)   RBC  Latest Range: 4.22-5.81 MIL/uL    3.61 (L)   Hemoglobin  Latest Range: 13.0-17.0 g/dL    81.1 (L)   HCT  Latest Range: 39.0-52.0 %    33.6 (L)   MCV  Latest Range: 78.0-100.0 fL    93.1   MCH  Latest Range: 26.0-34.0 pg    32.7   MCHC  Latest Range: 30.0-36.0 g/dL    91.4   RDW  Latest Range: 11.5-15.5 %    12.7   Platelets  Latest Range: 150-400 K/uL    196     Exam  Gen: Awake and alert, NAD, Pt smoking e-cig on entrance to room today Lungs: clear B Cardiac: reg s1 and s2 Abd: + BS,  NT Ext:             Left Lower Extremity             Ex fix stable             pinsites stable             Calcaneal pins with drainage, expected             Faint toe motion appreciated             TN sensation > DPN and SPN             Ext warm             + DP pulse   Assessment and Plan  1 Day Post-Op 32 y/o white male s/p fall off ladder  1. Fall off ladder 2. Complex Left pilon fracture, tibia and fibula s/p ex fix and 4-compartment fasciotomies POD 1              NWB               Ice and elevate  Dressing change on Thursday at home             Plan for return to OR in 2 weeks  3. R radial head and neck fx, nondisplaced             WBAT thru elbow             Use platform walker             ROM as tolerated               No pushing, pulling activiites with extended elbow              3. Von Willebrands             Appears to be stable             No excessive bleeding noted  4. Pain management:             Titrate oral meds  5. DVT/PE prophylaxis:             Lovenox daily             Will d/c home on lovenox as well 6. ID:              completed course of Vanc  7. Activity:             OOB as tolerated             NWB L leg             WBAT thru L elbow   8.Ex-fix/Splint care:             Daily pin care-reviewed pin care and wound care with wife               9. Impediments to fracture and soft tissue healing             Nicotine use             Immobility/NWB  10. Dispo:            d/c home today             Follow up on Monday 09/06/2012   Disposition:  home  Discharge Orders   Future Orders Complete By Expires     Call MD / Call 911  As directed     Comments:      If you experience chest pain or shortness of breath, CALL 911 and be transported to the hospital emergency room.  If you develope a fever above 101 F, pus (white drainage) or increased drainage or redness at the wound, or calf pain, call your surgeon's office.     Constipation Prevention  As directed     Comments:      Drink plenty of fluids.  Prune juice may be helpful.  You may use a stool softener, such as Colace (over the counter) 100 mg twice a day.  Use MiraLax (over the counter) for constipation as needed.    Diet general  As directed     Discharge instructions  As directed     Comments:      Orthopaedic Trauma Service Discharge Instructions   General Discharge Instructions  WEIGHT BEARING STATUS: Nonweightbearing left leg  RANGE OF MOTION/ACTIVITY: Activity as tolerated while maintaining weight bearing restrictions  Diet: as you were eating previously.  Can use over the counter stool softeners and bowel preparations, such as Miralax, to  help with bowel movements.  Narcotics can be constipating.  Be sure to drink plenty of fluids  Wound care: Start wound care on 09/02/2012. Please see below for instructions  STOP SMOKING OR USING NICOTINE PRODUCTS!!!!  As discussed nicotine severely impairs your body's ability to heal surgical and traumatic wounds but also impairs bone healing.  Wounds and bone heal by forming microscopic blood vessels (angiogenesis) and nicotine is a vasoconstrictor (essentially, shrinks blood vessels).  Therefore, if vasoconstriction occurs to these microscopic blood vessels they essentially disappear and are unable to deliver necessary nutrients to the healing tissue.  This is one modifiable factor that you can do to dramatically increase your chances of healing your injury.    (This means no smoking, no nicotine gum, patches, etc)  DO NOT USE NONSTEROIDAL ANTI-INFLAMMATORY DRUGS (NSAID'S)  Using products such as Advil (ibuprofen), Aleve (naproxen), Motrin (ibuprofen) for additional pain control during fracture healing can delay and/or prevent the healing response.  If you would like to take over the counter (OTC) medication, Tylenol (acetaminophen) is ok.  However, some narcotic medications that are given for pain control  contain acetaminophen as well. Therefore, you should not exceed more than 4000 mg of tylenol in a day if you do not have liver disease.  Also note that there are may OTC medicines, such as cold medicines and allergy medicines that my contain tylenol as well.  If you have any questions about medications and/or interactions please ask your doctor/PA or your pharmacist.   PAIN MEDICATION USE AND EXPECTATIONS  You have likely been given narcotic medications to help control your pain.  After a traumatic event that results in an fracture (broken bone) with or without surgery, it is ok to use narcotic pain medications to help control one's pain.  We understand that everyone responds to pain differently and each individual patient will be evaluated on a regular basis for the continued need for narcotic medications. Ideally, narcotic medication use should last no more than 6-8 weeks (coinciding with fracture healing).   As a patient it is your responsibility as well to monitor narcotic medication use and report the amount and frequency you use these medications when you come to your office visit.   We would also advise that if you are using narcotic medications, you should take a dose prior to therapy to maximize you participation.  IF YOU ARE ON NARCOTIC MEDICATIONS IT IS NOT PERMISSIBLE TO OPERATE A MOTOR VEHICLE (MOTORCYCLE/CAR/TRUCK/MOPED) OR HEAVY MACHINERY DO NOT MIX NARCOTICS WITH OTHER CNS (CENTRAL NERVOUS SYSTEM) DEPRESSANTS SUCH AS ALCOHOL       ICE AND ELEVATE INJURED/OPERATIVE EXTREMITY  Using ice and elevating the injured extremity above your heart can help with swelling and pain control.  Icing in a pulsatile fashion, such as 20 minutes on and 20 minutes off, can be followed.    Do not place ice directly on skin. Make sure there is a barrier between to skin and the ice pack.    Using frozen items such as frozen peas works well as the conform nicely to the are that needs to be iced.  USE AN ACE  WRAP OR TED HOSE FOR SWELLING CONTROL  In addition to icing and elevation, Ace wraps or TED hose are used to help limit and resolve swelling.  It is recommended to use Ace wraps or TED hose until you are informed to stop.    When using Ace Wraps start the wrapping distally (farthest away from the body) and  wrap proximally (closer to the body)   Example: If you had surgery on your leg or thing and you do not have a splint on, start the ace wrap at the toes and work your way up to the thigh        If you had surgery on your upper extremity and do not have a splint on, start the ace wrap at your fingers and work your way up to the upper arm  IF YOU ARE IN A SPLINT OR CAST DO NOT REMOVE IT FOR ANY REASON   If your splint gets wet for any reason please contact the office immediately. You may shower in your splint or cast as long as you keep it dry.  This can be done by wrapping in a cast cover or garbage back (or similar)  Do Not stick any thing down your splint or cast such as pencils, money, or hangers to try and scratch yourself with.  If you feel itchy take benadryl as prescribed on the bottle for itching  IF YOU ARE IN A CAM BOOT (BLACK BOOT)  You may remove boot periodically. Perform daily dressing changes as noted below.  Wash the liner of the boot regularly and wear a sock when wearing the boot. It is recommended that you sleep in the boot until told otherwise  CALL THE OFFICE WITH ANY QUESTIONS OR CONCERTS: [phone]     Discharge Pin Site Instructions  Dress pins daily with Kerlix roll starting on POD 2. Wrap the Kerlix so that it tamps the skin down around the pin-skin interface to prevent/limit motion of the skin relative to the pin.  (Pin-skin motion is the primary cause of pain and infection related to external fixator pin sites).  Remove any crust or coagulum that may obstruct drainage with a saline moistened gauze or soap and water.  After POD 3, if there is no discernable  drainage on the pin site dressing, the interval for change can by increased to every other day.  You may shower with the fixator, cleaning all pin sites gently with soap and water.  If you have a surgical wound this needs to be completely dry and without drainage before showering.  The extremity can be lifted by the fixator to facilitate wound care and transfers.  Notify the office/Doctor if you experience increasing drainage, redness, or pain from a pin site, or if you notice purulent (thick, snot-like) drainage.  Discharge Wound Care Instructions  Do NOT apply any ointments, solutions or lotions to pin sites or surgical wounds.  These prevent needed drainage and even though solutions like hydrogen peroxide kill bacteria, they also damage cells lining the pin sites that help fight infection.  Applying lotions or ointments can keep the wounds moist and can cause them to breakdown and open up as well. This can increase the risk for infection. When in doubt call the office.  Surgical incisions should be dressed daily.  If any drainage is noted, use one layer of adaptic, then gauze, Kerlix, and an ace wrap.  Once the incision is completely dry and without drainage, it may be left open to air out.  Showering may begin 36-48 hours later.  Cleaning gently with soap and water.  Traumatic wounds should be dressed daily as well.    One layer of adaptic, gauze, Kerlix, then ace wrap.  The adaptic can be discontinued once the draining has ceased    If you have a wet to dry dressing: wet the  gauze with saline the squeeze as much saline out so the gauze is moist (not soaking wet), place moistened gauze over wound, then place a dry gauze over the moist one, followed by Kerlix wrap, then ace wrap.    Driving restrictions  As directed     Comments:      No driving    Increase activity slowly as tolerated  As directed     Non weight bearing  As directed         Medication List    STOP taking these  medications       ibuprofen 200 MG tablet  Commonly known as:  ADVIL,MOTRIN      TAKE these medications       DSS 100 MG Caps  Take 100 mg by mouth 2 (two) times daily as needed for constipation.     enoxaparin 40 MG/0.4ML injection  Commonly known as:  LOVENOX  Inject 0.4 mLs (40 mg total) into the skin daily.     methocarbamol 500 MG tablet  Commonly known as:  ROBAXIN  Take 1-2 tablets (500-1,000 mg total) by mouth 4 (four) times daily.     oxyCODONE 5 MG immediate release tablet  Commonly known as:  Oxy IR/ROXICODONE  Take 1-3 tablets (5-15 mg total) by mouth every 6 (six) hours as needed for pain (Breakthrough pain).     oxyCODONE-acetaminophen 5-325 MG per tablet  Commonly known as:  PERCOCET/ROXICET  Take 1-2 tablets by mouth every 6 (six) hours as needed for pain.     polyethylene glycol packet  Commonly known as:  MIRALAX / GLYCOLAX  Take 17 g by mouth daily as needed.     pregabalin 75 MG capsule  Commonly known as:  LYRICA  Take 1 capsule (75 mg total) by mouth 2 (two) times daily.           Follow-up Information   Follow up with Budd Palmer, MD. Schedule an appointment as soon as possible for a visit on 09/06/2012. (Call for appointment)    Contact information:   [address] [place] [address] [phone]       Discharge Instructions and Plan:  Patient has sustained a severe injury to his left distal tibia. due to the extent that his swelling as well as acute compartment syndrome  we had to temporize patient with a spanning external fixator and perform the necessary 4 compartment fasciotomies to relieve pressure. This will delay definitive fixation by approximately 2 weeks or so. Once the patient has demonstrated adequate healing and soft tissue swelling resolution we can then proceed with definitive fixation. Patient again has sustained a very complex injury that will require extensive reconstruction.    patient will be nonweightbearing for now and for 8 weeks after his definitive procedure. Patient will continue aggressive ice and elevation. He will move his toes as he can tolerate. He can move his knee as he can tolerate as well Patient will remain on Lovenox for DVT and PE prophylaxis. Patient reports a history of von Willebrand's disease however we have not appreciated any evidence of excessive bleeding. We'll continue to monitor the patient for this. The patient will be on Percocet, oxycodone Lyrica and Robaxin for pain control. I did review wound care and ex fix care with the patient and his wife and instructed them that all the information be found in her discharge paperwork. We will check the patient back on Monday, 09/06/2012 to evaluate his  soft tissues  Again patient does have a nicotine dependence. I did observe him using his E. cigarette in the hospital room. Again we discussed the importance of nicotine cessation on wound and bone healing. I am concerned that the patient may ultimately develop a nonunion or wound infections with continued nicotine use. I did explain this to the patient and wife.  Patient will contact us with any questions or concerns in the interim  Signed:  Mearl Latin, PA-C [facility] [phone] (P) 08/31/2012, 1:09 PM

## 2012-09-01 NOTE — Anesthesia Postprocedure Evaluation (Signed)
  Anesthesia Post-op Note  Patient: Ryan Santiago  Procedure(s) Performed: Procedure(s): EXTERNAL FIXATION Left Tibia fuibula fracture (Left) RELEASE DORSAL COMPARTMENT (DEQUERVAIN) (Left) APPLICATION OF WOUND VAC (Left)  Patient Location: PACU  Anesthesia Type:General  Level of Consciousness: awake, alert  and oriented  Airway and Oxygen Therapy: Patient Spontanous Breathing  Post-op Pain: mild  Post-op Assessment: Post-op Vital signs reviewed, Patient's Cardiovascular Status Stable, Patent Airway and Pain level controlled  Post-op Vital Signs: stable  Complications: No apparent anesthesia complications

## 2012-09-24 NOTE — Progress Notes (Signed)
Phone number listed as patients home number is not a number for him- it is a business number.  Emergency contact number is not in working order.

## 2012-09-28 ENCOUNTER — Encounter (HOSPITAL_COMMUNITY)
Admission: RE | Disposition: A | Discharge status: Home Health | Payer: Self-pay | Source: Ambulatory Visit | Attending: Orthopedic Surgery

## 2012-09-28 ENCOUNTER — Encounter (HOSPITAL_COMMUNITY): Payer: Self-pay | Admitting: Anesthesiology

## 2012-09-28 ENCOUNTER — Inpatient Hospital Stay (HOSPITAL_COMMUNITY): Payer: Worker's Compensation

## 2012-09-28 ENCOUNTER — Inpatient Hospital Stay (HOSPITAL_COMMUNITY): Payer: Worker's Compensation | Admitting: Anesthesiology

## 2012-09-28 ENCOUNTER — Inpatient Hospital Stay (HOSPITAL_COMMUNITY)
Admission: RE | Admit: 2012-09-28 | Discharge: 2012-10-01 | DRG: 493 | Disposition: A | Discharge status: Home Health | Payer: Worker's Compensation | Source: Ambulatory Visit | Attending: Orthopedic Surgery | Admitting: Orthopedic Surgery

## 2012-09-28 DIAGNOSIS — Z4789 Encounter for other orthopedic aftercare: Secondary | ICD-10-CM

## 2012-09-28 DIAGNOSIS — I498 Other specified cardiac arrhythmias: Secondary | ICD-10-CM | POA: Diagnosis not present

## 2012-09-28 DIAGNOSIS — R000 Tachycardia, unspecified: Secondary | ICD-10-CM | POA: Clinically undetermined

## 2012-09-28 DIAGNOSIS — Z79899 Other long term (current) drug therapy: Secondary | ICD-10-CM

## 2012-09-28 DIAGNOSIS — E871 Hypo-osmolality and hyponatremia: Secondary | ICD-10-CM | POA: Diagnosis not present

## 2012-09-28 DIAGNOSIS — S82209A Unspecified fracture of shaft of unspecified tibia, initial encounter for closed fracture: Principal | ICD-10-CM | POA: Diagnosis present

## 2012-09-28 DIAGNOSIS — D6800 Von Willebrand disease, unspecified: Secondary | ICD-10-CM | POA: Diagnosis present

## 2012-09-28 DIAGNOSIS — S82409A Unspecified fracture of shaft of unspecified fibula, initial encounter for closed fracture: Principal | ICD-10-CM | POA: Diagnosis present

## 2012-09-28 DIAGNOSIS — W11XXXA Fall on and from ladder, initial encounter: Secondary | ICD-10-CM | POA: Diagnosis present

## 2012-09-28 DIAGNOSIS — D649 Anemia, unspecified: Secondary | ICD-10-CM | POA: Diagnosis not present

## 2012-09-28 DIAGNOSIS — Z23 Encounter for immunization: Secondary | ICD-10-CM

## 2012-09-28 DIAGNOSIS — F17200 Nicotine dependence, unspecified, uncomplicated: Secondary | ICD-10-CM | POA: Diagnosis present

## 2012-09-28 DIAGNOSIS — J9811 Atelectasis: Secondary | ICD-10-CM

## 2012-09-28 DIAGNOSIS — S82872D Displaced pilon fracture of left tibia, subsequent encounter for closed fracture with routine healing: Secondary | ICD-10-CM

## 2012-09-28 DIAGNOSIS — S82872A Displaced pilon fracture of left tibia, initial encounter for closed fracture: Secondary | ICD-10-CM

## 2012-09-28 HISTORY — PX: OPEN REDUCTION INTERNAL FIXATION (ORIF) TIBIA/FIBULA FRACTURE: SHX5992

## 2012-09-28 HISTORY — DX: Tachycardia, unspecified: R00.0

## 2012-09-28 HISTORY — PX: ORIF ANKLE FRACTURE: SHX5408

## 2012-09-28 HISTORY — DX: Tobacco use: Z72.0

## 2012-09-28 HISTORY — DX: Other fracture of left lower leg, initial encounter for closed fracture: S82.892A

## 2012-09-28 LAB — COMPREHENSIVE METABOLIC PANEL
BUN: 19 mg/dL (ref 6–23)
CO2: 20 mEq/L (ref 19–32)
Calcium: 9.3 mg/dL (ref 8.4–10.5)
Chloride: 97 mEq/L (ref 96–112)
Creatinine, Ser: 0.9 mg/dL (ref 0.50–1.35)
GFR calc Af Amer: >90 mL/min (ref >90)
GFR calc non Af Amer: >90 mL/min (ref >90)
Glucose, Bld: 140 mg/dL — ABNORMAL HIGH (ref 70–99)
Total Bilirubin: 0.3 mg/dL (ref 0.3–1.2)

## 2012-09-28 LAB — APTT
aPTT: 30 seconds (ref 24–37)
aPTT: 30 seconds (ref 24–37)

## 2012-09-28 LAB — CBC WITH DIFFERENTIAL/PLATELET
Basophils Relative: 0 % (ref 0–1)
Basophils Relative: 0 % (ref 0–1)
Eosinophils Relative: 2 % (ref 0–5)
Eosinophils Relative: 0 % (ref 0–5)
HCT: 38.9 % — ABNORMAL LOW (ref 39.0–52.0)
HCT: 32.8 % — ABNORMAL LOW (ref 39.0–52.0)
Hemoglobin: 11.4 g/dL — ABNORMAL LOW (ref 13.0–17.0)
Lymphs Abs: 2.9 10*3/uL (ref 0.7–4.0)
Lymphs Abs: 2.5 10*3/uL (ref 0.7–4.0)
MCH: 32.9 pg (ref 26.0–34.0)
MCV: 94.6 fL (ref 78.0–100.0)
MCV: 94.8 fL (ref 78.0–100.0)
Monocytes Absolute: 1.5 10*3/uL — ABNORMAL HIGH (ref 0.1–1.0)
Monocytes Relative: 14 % — ABNORMAL HIGH (ref 3–12)
Monocytes Relative: 8 % (ref 3–12)
Neutro Abs: 3 10*3/uL (ref 1.7–7.7)
Neutro Abs: 14.2 10*3/uL — ABNORMAL HIGH (ref 1.7–7.7)
Platelets: 216 10*3/uL (ref 150–400)
RBC: 4.11 MIL/uL — ABNORMAL LOW (ref 4.22–5.81)
RBC: 3.46 MIL/uL — ABNORMAL LOW (ref 4.22–5.81)
RDW: 12.7 % (ref 11.5–15.5)
WBC: 7 10*3/uL (ref 4.0–10.5)

## 2012-09-28 LAB — PROTIME-INR: INR: 0.95 (ref 0.00–1.49)

## 2012-09-28 LAB — BASIC METABOLIC PANEL
CO2: 25 mEq/L (ref 19–32)
Chloride: 99 mEq/L (ref 96–112)
Creatinine, Ser: 0.9 mg/dL (ref 0.50–1.35)

## 2012-09-28 SURGERY — OPEN REDUCTION INTERNAL FIXATION (ORIF) ANKLE FRACTURE
Anesthesia: General | Site: Ankle | Laterality: Left | Wound class: Clean

## 2012-09-28 MED ORDER — LIDOCAINE HCL 4 % MT SOLN
OROMUCOSAL | Status: DC | PRN
  Administered 2012-09-28: 4 mL via TOPICAL

## 2012-09-28 MED ORDER — NALOXONE HCL 0.4 MG/ML IJ SOLN: 0.4000 mg | INTRAMUSCULAR | Status: DC | PRN

## 2012-09-28 MED ORDER — METOCLOPRAMIDE HCL 5 MG/ML IJ SOLN: 5.0000 mg | Freq: Three times a day (TID) | INTRAMUSCULAR | Status: DC | PRN

## 2012-09-28 MED ORDER — BISACODYL 10 MG RE SUPP: 10.0000 mg | Freq: Every day | RECTAL | Status: DC | PRN

## 2012-09-28 MED ORDER — FENTANYL CITRATE 0.05 MG/ML IJ SOLN: 50.0000 ug | INTRAMUSCULAR | Status: DC | PRN

## 2012-09-28 MED ORDER — 0.9 % SODIUM CHLORIDE (POUR BTL) OPTIME
TOPICAL | Status: DC | PRN
  Administered 2012-09-28: 1000 mL

## 2012-09-28 MED ORDER — FENTANYL CITRATE 0.05 MG/ML IJ SOLN
INTRAMUSCULAR | Status: DC | PRN
  Administered 2012-09-28: 100 ug via INTRAVENOUS
  Administered 2012-09-28: 50 ug via INTRAVENOUS
  Administered 2012-09-28 (×2): 100 ug via INTRAVENOUS
  Administered 2012-09-28 (×4): 50 ug via INTRAVENOUS
  Administered 2012-09-28: 100 ug via INTRAVENOUS
  Administered 2012-09-28 (×7): 50 ug via INTRAVENOUS

## 2012-09-28 MED ORDER — CYCLOBENZAPRINE HCL 10 MG PO TABS
10.0000 mg | ORAL_TABLET | Freq: Three times a day (TID) | ORAL | Status: DC
  Administered 2012-09-28 – 2012-10-01 (×9): 10 mg via ORAL
  Filled 2012-09-28 (×14): qty 1

## 2012-09-28 MED ORDER — DOCUSATE SODIUM 100 MG PO CAPS
100.0000 mg | ORAL_CAPSULE | Freq: Two times a day (BID) | ORAL | Status: DC
  Administered 2012-09-28 – 2012-09-30 (×6): 100 mg via ORAL
  Filled 2012-09-28 (×7): qty 1

## 2012-09-28 MED ORDER — FENTANYL 10 MCG/ML IV SOLN
INTRAVENOUS | Status: DC
  Administered 2012-09-28: 135 ug via INTRAVENOUS
  Administered 2012-09-28: 225 ug via INTRAVENOUS
  Filled 2012-09-28 (×2): qty 50

## 2012-09-28 MED ORDER — ONDANSETRON HCL 4 MG/2ML IJ SOLN: 4.0000 mg | Freq: Four times a day (QID) | INTRAMUSCULAR | Status: DC | PRN

## 2012-09-28 MED ORDER — PREGABALIN 50 MG PO CAPS
75.0000 mg | ORAL_CAPSULE | Freq: Three times a day (TID) | ORAL | Status: DC
  Administered 2012-09-28 – 2012-10-01 (×8): 75 mg via ORAL
  Filled 2012-09-28 (×9): qty 1

## 2012-09-28 MED ORDER — TEMAZEPAM 15 MG PO CAPS: 15.0000 mg | ORAL_CAPSULE | Freq: Every evening | ORAL | Status: DC | PRN

## 2012-09-28 MED ORDER — FENTANYL CITRATE 0.05 MG/ML IJ SOLN
INTRAMUSCULAR | Status: AC
  Administered 2012-09-28: 25 ug
  Filled 2012-09-28: qty 2

## 2012-09-28 MED ORDER — ENOXAPARIN SODIUM 40 MG/0.4ML ~~LOC~~ SOLN
40.0000 mg | SUBCUTANEOUS | Status: DC
  Administered 2012-09-29 – 2012-09-30 (×2): 40 mg via SUBCUTANEOUS
  Filled 2012-09-28 (×3): qty 0.4

## 2012-09-28 MED ORDER — PROMETHAZINE HCL 25 MG/ML IJ SOLN
25.0000 mg | Freq: Four times a day (QID) | INTRAMUSCULAR | Status: DC | PRN
  Administered 2012-09-28: 25 mg via INTRAVENOUS

## 2012-09-28 MED ORDER — LIDOCAINE HCL (CARDIAC) 20 MG/ML IV SOLN
INTRAVENOUS | Status: DC | PRN
  Administered 2012-09-28: 60 mg via INTRAVENOUS

## 2012-09-28 MED ORDER — HYDROXYZINE HCL 25 MG PO TABS
50.0000 mg | ORAL_TABLET | Freq: Four times a day (QID) | ORAL | Status: DC | PRN
  Administered 2012-09-29: 50 mg via ORAL
  Filled 2012-09-28 (×2): qty 1

## 2012-09-28 MED ORDER — DIPHENHYDRAMINE HCL 12.5 MG/5ML PO ELIX: 12.5000 mg | ORAL_SOLUTION | Freq: Four times a day (QID) | ORAL | Status: DC | PRN

## 2012-09-28 MED ORDER — SODIUM CHLORIDE 0.9 % IJ SOLN: 9.0000 mL | INTRAMUSCULAR | Status: DC | PRN

## 2012-09-28 MED ORDER — VANCOMYCIN HCL IN DEXTROSE 1-5 GM/200ML-% IV SOLN
1000.0000 mg | Freq: Once | INTRAVENOUS | Status: AC
  Administered 2012-09-28: 1000 mg via INTRAVENOUS

## 2012-09-28 MED ORDER — OXYCODONE HCL 5 MG PO TABS
5.0000 mg | ORAL_TABLET | Freq: Four times a day (QID) | ORAL | Status: DC | PRN
  Administered 2012-09-28: 5 mg via ORAL

## 2012-09-28 MED ORDER — FENTANYL CITRATE 0.05 MG/ML IJ SOLN
INTRAMUSCULAR | Status: AC
  Filled 2012-09-28: qty 2

## 2012-09-28 MED ORDER — LACTATED RINGERS IV SOLN
INTRAVENOUS | Status: DC | PRN
  Administered 2012-09-28 (×3): via INTRAVENOUS

## 2012-09-28 MED ORDER — ROCURONIUM BROMIDE 100 MG/10ML IV SOLN
INTRAVENOUS | Status: DC | PRN
  Administered 2012-09-28: 50 mg via INTRAVENOUS
  Administered 2012-09-28: 30 mg via INTRAVENOUS
  Administered 2012-09-28: 20 mg via INTRAVENOUS

## 2012-09-28 MED ORDER — DIPHENHYDRAMINE HCL 50 MG/ML IJ SOLN: 12.5000 mg | Freq: Four times a day (QID) | INTRAMUSCULAR | Status: DC | PRN

## 2012-09-28 MED ORDER — POTASSIUM CHLORIDE IN NACL 20-0.9 MEQ/L-% IV SOLN
INTRAVENOUS | Status: DC
  Administered 2012-09-29: 06:00:00 via INTRAVENOUS
  Filled 2012-09-28 (×4): qty 1000

## 2012-09-28 MED ORDER — ARTIFICIAL TEARS OP OINT
TOPICAL_OINTMENT | OPHTHALMIC | Status: DC | PRN
  Administered 2012-09-28: 1 via OPHTHALMIC

## 2012-09-28 MED ORDER — HYDROMORPHONE HCL PF 1 MG/ML IJ SOLN: 0.2500 mg | INTRAMUSCULAR | Status: DC | PRN

## 2012-09-28 MED ORDER — FENTANYL 10 MCG/ML IV SOLN
INTRAVENOUS | Status: AC
  Administered 2012-09-28: 16:00:00 via INTRAVENOUS
  Filled 2012-09-28: qty 50

## 2012-09-28 MED ORDER — POLYETHYLENE GLYCOL 3350 17 G PO PACK
17.0000 g | PACK | Freq: Every day | ORAL | Status: DC
  Administered 2012-09-28 – 2012-10-01 (×4): 17 g via ORAL
  Filled 2012-09-28 (×4): qty 1

## 2012-09-28 MED ORDER — METOCLOPRAMIDE HCL 5 MG PO TABS
5.0000 mg | ORAL_TABLET | Freq: Three times a day (TID) | ORAL | Status: DC | PRN
  Filled 2012-09-28: qty 2

## 2012-09-28 MED ORDER — OXYCODONE-ACETAMINOPHEN 5-325 MG PO TABS
1.0000 | ORAL_TABLET | Freq: Four times a day (QID) | ORAL | Status: DC | PRN
Start: 2012-09-28 — End: 2012-10-01
  Administered 2012-09-28 – 2012-10-01 (×9): 2 via ORAL
  Filled 2012-09-28 (×9): qty 2

## 2012-09-28 MED ORDER — ONDANSETRON HCL 4 MG PO TABS: 4.0000 mg | ORAL_TABLET | Freq: Four times a day (QID) | ORAL | Status: DC | PRN

## 2012-09-28 MED ORDER — ONDANSETRON HCL 4 MG/2ML IJ SOLN
INTRAMUSCULAR | Status: DC | PRN
  Administered 2012-09-28: 4 mg via INTRAVENOUS

## 2012-09-28 MED ORDER — ONDANSETRON HCL 4 MG/2ML IJ SOLN: 4.0000 mg | Freq: Once | INTRAMUSCULAR | Status: DC | PRN

## 2012-09-28 MED ORDER — PROPRANOLOL HCL 1 MG/ML IV SOLN
INTRAVENOUS | Status: DC | PRN
  Administered 2012-09-28 (×4): .25 mg via INTRAVENOUS

## 2012-09-28 MED ORDER — PROPOFOL 10 MG/ML IV BOLUS
INTRAVENOUS | Status: DC | PRN
  Administered 2012-09-28: 200 mg via INTRAVENOUS

## 2012-09-28 MED ORDER — VANCOMYCIN HCL IN DEXTROSE 1-5 GM/200ML-% IV SOLN
1000.0000 mg | Freq: Two times a day (BID) | INTRAVENOUS | Status: AC
  Administered 2012-09-28: 1000 mg via INTRAVENOUS
  Filled 2012-09-28 (×2): qty 200

## 2012-09-28 MED ORDER — VANCOMYCIN HCL IN DEXTROSE 1-5 GM/200ML-% IV SOLN
INTRAVENOUS | Status: AC
  Filled 2012-09-28: qty 200

## 2012-09-28 MED ORDER — OXYCODONE HCL 5 MG PO TABS
ORAL_TABLET | ORAL | Status: AC
  Filled 2012-09-28: qty 1

## 2012-09-28 MED ORDER — OXYCODONE HCL 5 MG PO TABS
5.0000 mg | ORAL_TABLET | ORAL | Status: DC | PRN
  Administered 2012-09-28 – 2012-10-01 (×10): 10 mg via ORAL
  Filled 2012-09-28 (×11): qty 2

## 2012-09-28 MED ORDER — PNEUMOCOCCAL VAC POLYVALENT 25 MCG/0.5ML IJ INJ
0.5000 mL | INJECTION | INTRAMUSCULAR | Status: DC
  Filled 2012-09-28: qty 0.5

## 2012-09-28 MED ORDER — FENTANYL CITRATE 0.05 MG/ML IJ SOLN
25.0000 ug | INTRAMUSCULAR | Status: AC | PRN
  Administered 2012-09-28 (×5): 25 ug via INTRAVENOUS

## 2012-09-28 SURGICAL SUPPLY — 88 items
BANDAGE ELASTIC 4 VELCRO ST LF (GAUZE/BANDAGES/DRESSINGS) ×2 IMPLANT
BANDAGE ELASTIC 6 VELCRO ST LF (GAUZE/BANDAGES/DRESSINGS) ×2 IMPLANT
BANDAGE ESMARK 6X9 LF (GAUZE/BANDAGES/DRESSINGS) ×1 IMPLANT
BANDAGE GAUZE ELAST BULKY 4 IN (GAUZE/BANDAGES/DRESSINGS) ×2 IMPLANT
BIT DRILL 2.5X2.75 QC CALB (BIT) ×2 IMPLANT
BIT DRILL 2.9 CANN QC NONSTRL (BIT) ×2 IMPLANT
BIT DRILL 3.5X5.5 QC CALB (BIT) ×2 IMPLANT
BIT DRILL CALIBRATED 2.7 (BIT) ×2 IMPLANT
BLADE SURG 10 STRL SS (BLADE) IMPLANT
BNDG COHESIVE 4X5 TAN STRL (GAUZE/BANDAGES/DRESSINGS) IMPLANT
BNDG ESMARK 6X9 LF (GAUZE/BANDAGES/DRESSINGS) ×2
BRUSH SCRUB DISP (MISCELLANEOUS) ×4 IMPLANT
CLOTH BEACON ORANGE TIMEOUT ST (SAFETY) ×2 IMPLANT
COVER SURGICAL LIGHT HANDLE (MISCELLANEOUS) ×4 IMPLANT
DRAPE C-ARM 42X72 X-RAY (DRAPES) ×2 IMPLANT
DRAPE C-ARMOR (DRAPES) ×2 IMPLANT
DRAPE EXTREMITY T 121X128X90 (DRAPE) ×2 IMPLANT
DRAPE ORTHO SPLIT 77X108 STRL (DRAPES)
DRAPE PROXIMA HALF (DRAPES) ×4 IMPLANT
DRAPE SURG ORHT 6 SPLT 77X108 (DRAPES) IMPLANT
DRAPE U-SHAPE 47X51 STRL (DRAPES) ×2 IMPLANT
DRSG EMULSION OIL 3X3 NADH (GAUZE/BANDAGES/DRESSINGS) ×2 IMPLANT
ELECT REM PT RETURN 9FT ADLT (ELECTROSURGICAL) ×2
ELECTRODE REM PT RTRN 9FT ADLT (ELECTROSURGICAL) ×1 IMPLANT
GLOVE BIO SURGEON STRL SZ7.5 (GLOVE) ×2 IMPLANT
GLOVE BIO SURGEON STRL SZ8 (GLOVE) ×6 IMPLANT
GLOVE BIOGEL PI IND STRL 7.5 (GLOVE) ×1 IMPLANT
GLOVE BIOGEL PI IND STRL 8 (GLOVE) ×1 IMPLANT
GLOVE BIOGEL PI INDICATOR 7.5 (GLOVE) ×1
GLOVE BIOGEL PI INDICATOR 8 (GLOVE) ×1
GOWN PREVENTION PLUS XLARGE (GOWN DISPOSABLE) ×2 IMPLANT
GOWN STRL NON-REIN LRG LVL3 (GOWN DISPOSABLE) ×6 IMPLANT
K-WIRE ACE 1.6X6 (WIRE) ×8
KIT BASIN OR (CUSTOM PROCEDURE TRAY) ×2 IMPLANT
KIT INFUSE MEDIUM (Orthopedic Implant) ×2 IMPLANT
KIT ROOM TURNOVER OR (KITS) ×2 IMPLANT
KWIRE ACE 1.6X6 (WIRE) ×4 IMPLANT
MANIFOLD NEPTUNE II (INSTRUMENTS) IMPLANT
NAIL FLEXIBLE WIN 2.5MM (Nail) ×4 IMPLANT
NEEDLE HYPO 21X1.5 SAFETY (NEEDLE) IMPLANT
NS IRRIG 1000ML POUR BTL (IV SOLUTION) ×2 IMPLANT
PACK GENERAL/GYN (CUSTOM PROCEDURE TRAY) ×2 IMPLANT
PAD ARMBOARD 7.5X6 YLW CONV (MISCELLANEOUS) ×4 IMPLANT
PAD CAST 4YDX4 CTTN HI CHSV (CAST SUPPLIES) ×1 IMPLANT
PADDING CAST COTTON 4X4 STRL (CAST SUPPLIES) ×1
PADDING CAST COTTON 6X4 STRL (CAST SUPPLIES) ×4 IMPLANT
PENCIL BUTTON HOLSTER BLD 10FT (ELECTRODE) IMPLANT
PLATE 9H LT DIST ANTLAT TIB (Plate) ×1 IMPLANT
PLATE ACE 100DEG 8HOLE (Plate) ×2 IMPLANT
PLATE ANTLAT CNTR W 157X9 (Plate) ×1 IMPLANT
PLATE SPIDER 16 (Washer) ×2 IMPLANT
SCREW ACE CAN 4.0 34M (Screw) ×2 IMPLANT
SCREW ACE CAN 4.0 55M (Screw) ×2 IMPLANT
SCREW CORT FT 32X3.5XNONLOCK (Screw) ×1 IMPLANT
SCREW CORTICAL 3.5MM  16MM (Screw) ×3 IMPLANT
SCREW CORTICAL 3.5MM  32MM (Screw) ×1 IMPLANT
SCREW CORTICAL 3.5MM  34MM (Screw) ×2 IMPLANT
SCREW CORTICAL 3.5MM 14MM (Screw) ×2 IMPLANT
SCREW CORTICAL 3.5MM 16MM (Screw) ×3 IMPLANT
SCREW CORTICAL 3.5MM 34MM (Screw) ×2 IMPLANT
SCREW CORTICAL 3.5X46MM (Screw) ×2 IMPLANT
SCREW LOCK 3.5X26 DIST TIB (Screw) ×2 IMPLANT
SCREW LOCK 3.5X40 DIST TIB (Screw) ×2 IMPLANT
SCREW LOCK 3.5X46 DIST TIB (Screw) ×2 IMPLANT
SCREW LOCK CORT STAR 3.5X24 (Screw) ×2 IMPLANT
SCREW LOCK CORT STAR 3.5X40 (Screw) ×4 IMPLANT
SCREW LOW PROFILE 3.5MMX42 (Screw) ×2 IMPLANT
SCREW LP 3.5X44 (Screw) ×2 IMPLANT
SCREW NLOCK CANC HEX 4X35 (Screw) ×2 IMPLANT
SPONGE GAUZE 4X4 12PLY (GAUZE/BANDAGES/DRESSINGS) ×2 IMPLANT
SPONGE LAP 18X18 X RAY DECT (DISPOSABLE) ×2 IMPLANT
SPONGE SCRUB IODOPHOR (GAUZE/BANDAGES/DRESSINGS) ×6 IMPLANT
STAPLER VISISTAT 35W (STAPLE) IMPLANT
SUCTION FRAZIER TIP 10 FR DISP (SUCTIONS) ×2 IMPLANT
SUT ETHILON 2 0 FS 18 (SUTURE) ×2 IMPLANT
SUT ETHILON 3 0 PS 1 (SUTURE) ×10 IMPLANT
SUT PDS AB 2-0 CT1 27 (SUTURE) IMPLANT
SUT VIC AB 0 CT1 27 (SUTURE) ×2
SUT VIC AB 0 CT1 27XBRD ANBCTR (SUTURE) ×2 IMPLANT
SUT VIC AB 2-0 CT1 27 (SUTURE) ×2
SUT VIC AB 2-0 CT1 TAPERPNT 27 (SUTURE) ×2 IMPLANT
SUT VIC AB 2-0 CT3 27 (SUTURE) IMPLANT
SYR CONTROL 10ML LL (SYRINGE) IMPLANT
TOWEL OR 17X24 6PK STRL BLUE (TOWEL DISPOSABLE) ×2 IMPLANT
TOWEL OR 17X26 10 PK STRL BLUE (TOWEL DISPOSABLE) ×4 IMPLANT
TUBE CONNECTING 12X1/4 (SUCTIONS) ×2 IMPLANT
UNDERPAD 30X30 INCONTINENT (UNDERPADS AND DIAPERS) ×6 IMPLANT
WATER STERILE IRR 1000ML POUR (IV SOLUTION) ×2 IMPLANT

## 2012-09-28 NOTE — Transfer of Care (Signed)
Immediate Anesthesia Transfer of Care Note  Patient: Ryan Santiago  Procedure(s) Performed: Procedure(s): OPEN REDUCTION INTERNAL FIXATION (ORIF) DISTAL TIBIA FRACTURE, REMOVING EXTERNAL FIXATOR (Left)  Patient Location: PACU  Anesthesia Type:General  Level of Consciousness: lethargic and responds to stimulation  Airway & Oxygen Therapy: Patient Spontanous Breathing and Patient connected to nasal cannula oxygen  Post-op Assessment: Report given to PACU RN  Post vital signs: Reviewed and stable  Complications: No apparent anesthesia complications

## 2012-09-28 NOTE — Brief Op Note (Addendum)
09/28/2012  1:27 PM  PATIENT:  Ryan Santiago  32 y.o. male  PRE-OPERATIVE DIAGNOSIS:   1. LEFT pilon fracture, tibia and fibula 2. Retained external fixator  POST-OPERATIVE DIAGNOSIS: 1. LEFT PILON FRACTURE, tibia and fibula 2. Retained external fixator  PROCEDURE:  Procedure(s): 1. OPEN REDUCTION INTERNAL FIXATION (ORIF) DISTAL TIBIA FRACTURE, tibia and fibula 2. REMOVING EXTERNAL FIXATOR (Left)  SURGEON:  Surgeon(s) and Role:    * Budd Palmer, MD - Primary  PHYSICIAN ASSISTANT: Montez Morita, [facility]  ANESTHESIA:   general  EBL:  Total I/O In: 2000 [I.V.:2000] Out: 600 [Urine:350; Blood:250]  BLOOD ADMINISTERED:none  DRAINS: none   LOCAL MEDICATIONS USED:  NONE  SPECIMEN:  No Specimen  DISPOSITION OF SPECIMEN:  N/A  COUNTS:  YES  TOURNIQUET:   Total Tourniquet Time Documented: Thigh (Left) - 130 minutes Total: Thigh (Left) - 130 minutes   DICTATION:[id number]  PLAN OF CARE: Admit to inpatient   PATIENT DISPOSITION:  PACU - hemodynamically stable.   Delay start of Pharmacological VTE agent (>24hrs) due to surgical blood loss or risk of bleeding: no

## 2012-09-28 NOTE — Progress Notes (Signed)
Orthopedic Tech Progress Note Patient Details:  WADSWORTH SKOLNICK 1980/09/09 [id number] Applied overhead frame     Jennye Moccasin 09/28/2012, 5:20 PM

## 2012-09-28 NOTE — H&P (Signed)
I have seen and examined the patient. I agree with the findings above.  I discussed with the patient and his wife the risks and benefits of surgery, including the possibility of malunion, nonunion, infection, nerve injury, vessel injury, wound breakdown, arthritis, symptomatic hardware, DVT/ PE, loss of motion, and need for further surgery among others.  We also specifically discussed the elevated risk of soft tissue breakdown that could lead to amputation.  He understood these risks and wished to proceed.   Budd Palmer, MD 09/28/2012 1:41 PM

## 2012-09-28 NOTE — Anesthesia Procedure Notes (Signed)
Procedure Name: Intubation Date/Time: 09/28/2012 8:25 AM Performed by: Jefm Miles E Pre-anesthesia Checklist: Timeout performed, Patient identified, Emergency Drugs available, Suction available and Patient being monitored Patient Re-evaluated:Patient Re-evaluated prior to inductionOxygen Delivery Method: Circle system utilized Preoxygenation: Pre-oxygenation with 100% oxygen Intubation Type: IV induction Ventilation: Mask ventilation without difficulty Laryngoscope Size: Mac and 3 Grade View: Grade I Tube type: Oral Tube size: 7.5 mm Number of attempts: 1 Airway Equipment and Method: Stylet Placement Confirmation: ETT inserted through vocal cords under direct vision,  breath sounds checked- equal and bilateral and positive ETCO2 Secured at: 22 cm Tube secured with: Tape Dental Injury: Teeth and Oropharynx as per pre-operative assessment

## 2012-09-28 NOTE — H&P (Signed)
[facility] H&P  Chief Complaint:  L pilon fx with retained ex fix  HPI:   32 y/o white male known to OTS for fall sustained 08/28/2012. Pt fell from a ladder, approximately 10 ft, and hit the bottom rung with his L foot. This resulted in a severe injury to his L distal tibia. Pt was placed in an ex fix and had 4 compartment fasciotomies for acute compartment syndrome.  Due to the extent of the soft tissue swelling pt was unable to undergo early fixation. Pt has been followed serial at our office, closely monitoring the status of his soft tissue.  He is now ready for ORIF and possible removal of ex fix.   Past Medical History  Diagnosis Date  . Von Willebrand disease     Past Surgical History  Procedure Laterality Date  . Colon surgery      small intestinal removal  . External fixation leg Left 08/28/2012    Procedure: EXTERNAL FIXATION Left Tibia fuibula fracture;  Surgeon: Budd Palmer, MD;  Location: [facility] OR;  Service: Orthopedics;  Laterality: Left;  . Dorsal compartment release Left 08/28/2012    Procedure: RELEASE DORSAL COMPARTMENT (DEQUERVAIN);  Surgeon: Budd Palmer, MD;  Location: [facility] OR;  Service: Orthopedics;  Laterality: Left;  . Application of wound vac Left 08/28/2012    Procedure: APPLICATION OF WOUND VAC;  Surgeon: Budd Palmer, MD;  Location: [facility] OR;  Service: Orthopedics;  Laterality: Left;  . Fasciotomy Left 08/30/2012    Procedure: FASCIOTOMY CLOSURE LEFT LEG;  Surgeon: Budd Palmer, MD;  Location: [facility] OR;  Service: Orthopedics;  Laterality: Left;    History reviewed. No pertinent family history. Social History:  reports that he has been smoking Cigarettes.  He has a 15 pack-year smoking history. He has never used smokeless tobacco. He reports that he does not drink alcohol or use illicit drugs.  Allergies:  Allergies  Allergen Reactions  . Morphine And Related Anaphylaxis    Arm became swollen, ultimately intubated  . Dilaudid (Hydromorphone  Hcl)     Hallucinations and disorientation  . Penicillins Hives    Medications Prior to Admission  Medication Sig Dispense Refill  . cyclobenzaprine (FLEXERIL) 10 MG tablet Take 10 mg by mouth 3 (three) times daily.      Marland Kitchen enoxaparin (LOVENOX) 40 MG/0.4ML injection Inject 0.4 mLs (40 mg total) into the skin daily.  30 Syringe  1  . oxyCODONE-acetaminophen (PERCOCET/ROXICET) 5-325 MG per tablet Take 1-2 tablets by mouth every 6 (six) hours as needed for pain.  90 tablet  0  . pregabalin (LYRICA) 75 MG capsule Take 1 capsule (75 mg total) by mouth 2 (two) times daily.  60 capsule  0    Results for orders placed during the hospital encounter of 09/28/12 (from the past 48 hour(s))  APTT     Status: None   Collection Time    09/28/12  7:24 AM      Result Value Range   aPTT 30  24 - 37 seconds  CBC WITH DIFFERENTIAL     Status: Abnormal (Preliminary result)   Collection Time    09/28/12  7:24 AM      Result Value Range   WBC 7.0  4.0 - 10.5 K/uL   RBC 4.11 (*) 4.22 - 5.81 MIL/uL   Hemoglobin 13.5  13.0 - 17.0 g/dL   HCT 81.1 (*) 91.4 - 78.2 %   MCV 94.6  78.0 - 100.0 fL  MCH 32.8  26.0 - 34.0 pg   MCHC 34.7  30.0 - 36.0 g/dL   RDW 65.7  84.6 - 96.2 %   Platelets 193  150 - 400 K/uL   Neutrophils Relative % PENDING  43 - 77 %   Neutro Abs PENDING  1.7 - 7.7 K/uL   Band Neutrophils PENDING  0 - 10 %   Lymphocytes Relative PENDING  12 - 46 %   Lymphs Abs PENDING  0.7 - 4.0 K/uL   Monocytes Relative PENDING  3 - 12 %   Monocytes Absolute PENDING  0.1 - 1.0 K/uL   Eosinophils Relative PENDING  0 - 5 %   Eosinophils Absolute PENDING  0.0 - 0.7 K/uL   Basophils Relative PENDING  0 - 1 %   Basophils Absolute PENDING  0.0 - 0.1 K/uL   WBC Morphology PENDING     RBC Morphology PENDING     Smear Review PENDING     nRBC PENDING  0 /100 WBC   Metamyelocytes Relative PENDING     Myelocytes PENDING     Promyelocytes Absolute PENDING     Blasts PENDING    PROTIME-INR     Status: None    Collection Time    09/28/12  7:24 AM      Result Value Range   Prothrombin Time 12.0  11.6 - 15.2 seconds   INR 0.89  0.00 - 1.49   No results found.  Review of Systems  Constitutional: Negative for fever and chills.  Eyes: Negative for blurred vision.  Respiratory: Negative for shortness of breath and wheezing.   Cardiovascular: Negative for chest pain and palpitations.  Gastrointestinal: Negative for nausea, vomiting and abdominal pain.  Musculoskeletal:       L ankle pain   Neurological:       Intermittent tingling L leg     Blood pressure 122/81, pulse 99, temperature 98.1 F (36.7 C), temperature source Oral, resp. rate 20, height 6' (1.829 m), weight 91.797 kg (202 lb 6 oz), SpO2 94.00%. Physical Exam  Constitutional: He is oriented to person, place, and time. He appears well-developed and well-nourished. No distress.  HENT:  Head: Normocephalic and atraumatic.  Eyes: EOM are normal.  Neck: Normal range of motion.  Cardiovascular: Normal rate and regular rhythm.   No murmur heard. Respiratory: Effort normal and breath sounds normal. No respiratory distress. He has no wheezes.  GI: Soft. Bowel sounds are normal. He exhibits no distension. There is no tenderness.  Musculoskeletal:  Left Leg   Retained ex fix L ankle   Swelling decreased   Medial wound over medial mall stable   Fasciotomy sites are stable   Distal motor and sensory functions grossly intact   Ext warm   Lymphadenopathy:    He has no cervical adenopathy.  Neurological: He is alert and oriented to person, place, and time.  Skin: Skin is warm.  Psychiatric: He has a normal mood and affect.     Assessment and Plan  32 y/o male with complex L pilon fracture with retained ex fix  1. Complex L pilon fracture  To OR for ORIF and possible ex fix removal  Admit after surgery for observation, pain control, therapies  NWB x 8 weeks   2. Nicotine dependence  3. DVT/PE prophylaxis  Lovenox post op  4.  Pain management  PCA post op  Continue with neuropathic agents as well  5. Dispo  OR today  Would anticipate 2-3 day  hospital sta  Mearl Latin, PA-C [facility] [phone] (P) 09/28/2012 8:05 AM

## 2012-09-28 NOTE — Preoperative (Signed)
Beta Blockers   Reason not to administer Beta Blockers:Not Applicable 

## 2012-09-28 NOTE — Anesthesia Postprocedure Evaluation (Signed)
  Anesthesia Post-op Note  Patient: Ryan Santiago  Procedure(s) Performed: Procedure(s): OPEN REDUCTION INTERNAL FIXATION (ORIF) DISTAL TIBIA FRACTURE, REMOVING EXTERNAL FIXATOR (Left)  Patient Location: PACU  Anesthesia Type:General  Level of Consciousness: awake, alert , oriented and patient cooperative  Airway and Oxygen Therapy: Patient Spontanous Breathing  Post-op Pain: mild  Post-op Assessment: Post-op Vital signs reviewed, Patient's Cardiovascular Status Stable, Respiratory Function Stable, Patent Airway, No signs of Nausea or vomiting and Pain level controlled  Post-op Vital Signs: stable  Complications: No apparent anesthesia complications

## 2012-09-28 NOTE — Progress Notes (Signed)
Pt locked out of pca fentanyl not time for other pain med Dr Alveda Reasons on calll ordered phenergan IV

## 2012-09-28 NOTE — Anesthesia Preprocedure Evaluation (Addendum)
Anesthesia Evaluation  Patient identified by MRN, date of birth, ID band Patient awake    Reviewed: Allergy & Precautions, H&P , NPO status , Patient's Chart, lab work & pertinent test results  Airway Mallampati: I TM Distance: >3 FB Neck ROM: full    Dental  (+) Teeth Intact and Dental Advisory Given   Pulmonary          Cardiovascular Rhythm:regular Rate:Normal     Neuro/Psych    GI/Hepatic   Endo/Other    Renal/GU      Musculoskeletal   Abdominal   Peds  Hematology  (+) Blood dyscrasia, ,   Anesthesia Other Findings   Reproductive/Obstetrics                         Anesthesia Physical Anesthesia Plan  ASA: II  Anesthesia Plan: General   Post-op Pain Management:    Induction: Intravenous  Airway Management Planned: Oral ETT  Additional Equipment:   Intra-op Plan:   Post-operative Plan: Extubation in OR  Informed Consent: I have reviewed the patients History and Physical, chart, labs and discussed the procedure including the risks, benefits and alternatives for the proposed anesthesia with the patient or authorized representative who has indicated his/her understanding and acceptance.     Plan Discussed with: CRNA, Anesthesiologist and Surgeon  Anesthesia Plan Comments:         Anesthesia Quick Evaluation

## 2012-09-29 ENCOUNTER — Encounter (HOSPITAL_COMMUNITY): Payer: Self-pay | Admitting: Orthopedic Surgery

## 2012-09-29 LAB — CBC
HCT: 31.7 % — ABNORMAL LOW (ref 39.0–52.0)
MCHC: 34.4 g/dL (ref 30.0–36.0)
Platelets: 198 10*3/uL (ref 150–400)
RDW: 13.1 % (ref 11.5–15.5)
WBC: 17.3 10*3/uL — ABNORMAL HIGH (ref 4.0–10.5)

## 2012-09-29 LAB — BASIC METABOLIC PANEL
Chloride: 94 mEq/L — ABNORMAL LOW (ref 96–112)
Creatinine, Ser: 0.83 mg/dL (ref 0.50–1.35)
GFR calc Af Amer: >90 mL/min (ref >90)
GFR calc non Af Amer: >90 mL/min (ref >90)

## 2012-09-29 MED ORDER — OXYCODONE HCL 5 MG PO TABS
5.0000 mg | ORAL_TABLET | Freq: Four times a day (QID) | ORAL | Status: DC | PRN
  Administered 2012-09-30: 10 mg via ORAL
  Administered 2012-09-30: 5 mg via ORAL
  Administered 2012-10-01: 10 mg via ORAL
  Filled 2012-09-29 (×2): qty 2
  Filled 2012-09-29: qty 1

## 2012-09-29 MED ORDER — PANTOPRAZOLE SODIUM 40 MG PO TBEC
40.0000 mg | DELAYED_RELEASE_TABLET | Freq: Every day | ORAL | Status: DC
  Administered 2012-09-29 – 2012-10-01 (×3): 40 mg via ORAL
  Filled 2012-09-29 (×3): qty 1

## 2012-09-29 NOTE — Evaluation (Signed)
Occupational Therapy Evaluation Patient Details Name: BANKS CHAIKIN MRN: [id number] DOB: 1980-08-05 Today's Date: 09/29/2012 Time: 9562-1308 OT Time Calculation (min): 29 min  OT Assessment / Plan / Recommendation Clinical Impression   This 32 y.o. Male s/p Lt distal tibia fx with external fixator.  He was admitted for removal of ext. Fixator and ORIF of fracture.  He has been at home with ex fix and NWB.  Wife is very supportive. .  Pt is doing well with BADLs - currently is supervision, but will progress quickly to modified independent.  He will benefit from a reacher and a cast cover at home for showering.  No further OT needed, will sign off.     OT Assessment  Patient does not need any further OT services    Follow Up Recommendations  No OT follow up;Supervision/Assistance - 24 hour;Supervision - Intermittent    Barriers to Discharge      Equipment Recommendations  Other (comment) (reacher; cast cover for showering)    Recommendations for Other Services    Frequency       Precautions / Restrictions Precautions Precautions: None Restrictions Weight Bearing Restrictions: Yes LLE Weight Bearing: Non weight bearing       ADL  Eating/Feeding: Independent Where Assessed - Eating/Feeding: Bed level Grooming: Wash/dry hands;Wash/dry face;Teeth care;Min guard Where Assessed - Grooming: Unsupported standing Upper Body Bathing: Set up Where Assessed - Upper Body Bathing: Unsupported sitting Lower Body Bathing: Min guard Where Assessed - Lower Body Bathing: Unsupported sit to stand Upper Body Dressing: Set up Where Assessed - Upper Body Dressing: Supine, head of bed up;Unsupported sitting Lower Body Dressing: Set up;Supervision/safety Where Assessed - Lower Body Dressing: Supine, head of bed up Toilet Transfer: Min guard;Supervision/safety Toilet Transfer Method: Sit to stand;Stand pivot Acupuncturist: Comfort height toilet;Grab bars Toileting - Clothing  Manipulation and Hygiene: Supervision/safety Where Assessed - Engineer, mining and Hygiene: Standing Equipment Used: Other (comment) (crutches, knee walker) Transfers/Ambulation Related to ADLs: Pt ambulated with crutches and supervision.  Poor tolerance for knee walker due to pain ADL Comments: Pt initially required min guard assist for standing balance, but quickly progressed to supervision    OT Diagnosis:    OT Problem List:   OT Treatment Interventions:     OT Goals    Visit Information  Last OT Received On: 09/29/12 Assistance Needed: +1 PT/OT Co-Evaluation/Treatment: Yes    Subjective Data  Subjective: "It will take me a bit"  to get used to cast Lt. LE Patient Stated Goal: To get better   Prior Functioning     Home Living Lives With: Spouse Available Help at Discharge: Family;Available 24 hours/day Type of Home: House Home Access: Ramped entrance Home Layout: One level Bathroom Shower/Tub: Tub/shower unit;Curtain Firefighter: Standard Bathroom Accessibility: Yes How Accessible: Accessible via wheelchair;Accessible via walker Home Adaptive Equipment: Shower chair with back;Tub transfer bench;Wheelchair - manual;Bedside commode/3-in-1 Additional Comments: Pt recently in hospital for external fixator on L LE; pt has became more indpendent while maintaining NWB status in past few weeks  Prior Function Level of Independence: Needs assistance Driving: Yes Vocation: Full time employment Comments: pt injured at work; fell off room Communication Communication: No difficulties Dominant Hand: Right         Vision/Perception     Cognition  Cognition Arousal/Alertness: Awake/alert Behavior During Therapy: WFL for tasks assessed/performed Overall Cognitive Status: Within Functional Limits for tasks assessed    Extremity/Trunk Assessment Right Upper Extremity Assessment RUE ROM/Strength/Tone: Within functional levels RUE  Coordination: WFL -  gross/fine motor Left Upper Extremity Assessment LUE ROM/Strength/Tone: Within functional levels LUE Coordination: WFL - gross/fine motor Right Lower Extremity Assessment RLE ROM/Strength/Tone: WFL for tasks assessed RLE Sensation: WFL - Light Touch Left Lower Extremity Assessment LLE ROM/Strength/Tone: Unable to fully assess;Due to pain;Due to precautions (unable to assess ankle; able to perform SLR ) LLE Sensation: WFL - Light Touch (on metatarsal and above cast ) Trunk Assessment Trunk Assessment: Normal     Mobility Bed Mobility Bed Mobility: Supine to Sit;Sitting - Scoot to Edge of Bed;Sit to Supine Supine to Sit: 6: Modified independent (Device/Increase time);HOB elevated;With rails Sitting - Scoot to Edge of Bed: 6: Modified independent (Device/Increase time) Sit to Supine: 6: Modified independent (Device/Increase time);With rail;HOB flat Details for Bed Mobility Assistance: pt demo good technique; requires increased time to complete tasks due to pain  Transfers Transfers: Sit to Stand;Stand to Sit Sit to Stand: 4: Min guard;From bed;From elevated surface Stand to Sit: 5: Supervision;To bed Details for Transfer Assistance: requires (A) for safety for intial sit to stand to steady; pt c/o light headedness initially. required verbal cues for safety and hand placement      Exercise     Balance Balance Balance Assessed: Yes Static Standing Balance Static Standing - Balance Support: Bilateral upper extremity supported;During functional activity Static Standing - Level of Assistance: 5: Stand by assistance   End of Session OT - End of Session Equipment Utilized During Treatment: Gait belt Activity Tolerance: Patient limited by pain Patient left: in bed;with call bell/phone within reach;with family/visitor present  GO     Madelynn Malson, Ursula Alert M 09/29/2012, 12:55 PM

## 2012-09-29 NOTE — Progress Notes (Addendum)
Pt PCA syringe Fentanyl with remaining 46 ml's wasted in sink. Witnessed per Ronaldo Miyamoto RN.

## 2012-09-29 NOTE — Op Note (Signed)
NAME:  Ryan Santiago, Ryan Santiago NO.:  [id number]  MEDICAL RECORD NO.:  [id number]  LOCATION:  5N02C                        FACILITY:  [facility]  PHYSICIAN:  Doralee Albino. Carola Frost, M.D. DATE OF BIRTH:  1981-01-12  DATE OF PROCEDURE:  09/28/2012 DATE OF DISCHARGE:                              OPERATIVE REPORT   PREOPERATIVE DIAGNOSES: 1. Left tibial pilon fracture, tibia and fibula. 2. Retained external fixator, left leg.  POSTOPERATIVE DIAGNOSES: 1. Left tibial pilon fracture, tibia and fibula. 2. Retained external fixator, left leg.  PROCEDURE: 1. Open reduction and internal of tibial pilon fracture, tibia and     fibula. 2. Removal of external fixator under anesthesia.  SURGEON:  Doralee Albino. Carola Frost, M.D.  ASSISTANT:  Mearl Latin, PA-C  ANESTHESIA:  General.  COMPLICATIONS:  None.  SPECIMENS:  None.  TOURNIQUET TIME:  129 minutes.  DISPOSITION:  To PACU.  CONDITION:  Stable.  BRIEF SUMMARY OF INDICATION FOR PROCEDURE:  Ryan Santiago is a 32 year old male who fell from the top of the ladder and landed on the bottom rung.  He had a irreducible fracture dislocation, which required external fixation.  Subsequent imaging with CT demonstrated incarceration of the posterior tib tendon within the joint.  The patient also had a eschar sign which were full-thickness and required nearly 4 weeks for adequate soft tissue resolution.  We discussed with him the other risk of wound breakdown could lead to deep infection and limb loss.  We also discussed the severity of the fracture involving the articular surface which appeared to be highly comminuted, and would likely lead to posttraumatic arthritis, loss of motion, and perhaps need for fusion.  The patient understood these risks, as well as the anesthetic complications, DVT, nerve and vessel injury, many others and did wish to proceed with reconstruction.  BRIEF SUMMARY OF PROCEDURE:  Ryan Santiago underwent induction of  general anesthesia after administration of vancomycin for preop antibiotics. His left lower extremity was prepped and draped in the usual sterile fashion.  A tourniquet was placed about the thigh.  After initial exposure, it was elevated to 275 mmHg.  Standard anterior approach was made carefully identifying the neurovascular bundle.  The anterior compartment was mobilized and this enabled evaluation of the comminuted lateral aspect of the distal tibia.  There was severe articular destruction on the tibial side, including just a shear piece of cartilage without any sufficient bone stock for repair.  This area was nearly 2 x 2 cm.  Furthermore, I had to make an auxiliary incision medially to remove the large fragment of bone from the neurovascular bundle region in the posterior tib.  Through the anterior incision, I did push out the posterior tibial tendon which appeared to be rather attenuated from being stretched across the articular fragments.  It was nonetheless intact and did hold it posteriorly during reduction of the posterior mallet to avoid recurrent entrapment.  The fibula was angulated and this needed to be addressed to improve reduction of the posterior malleolus.  I initially attempted a retrograde titanium rod for fixation of the fibula, but this was not successful and consequently we reverted back to plate which we were  able to apply to the same incision.  The anterior and medial fragments were able to derotate and pinned provisionally with multiple K-wires in similar fashion.  Again, the medial malleolus was reconstructed.  I did use the auxiliary incision area to facilitate this and then ultimately made a stab incision through which the screws could be placed for instrumentation while watching the reduction through the other incisions.  I did thoroughly irrigate the joint and surprising the articular surface on the talus appeared to be in excellent condition, but again  severe loss and fragmentation of the distal tibia.  Following this, I placed the Biomet after the lateral plate and secured it both distally and proximally.  The patellofemoral off the anterolateral aspect of the distal tibia was not sufficiently controlled with this, and I consequently acquired separate instrumentation with the spider plate. Furthermore, there were bone defects that were adjacent to the articular surface that were uncontained and these did require infuse allografting because allograft could not be expected to stay contained or ran the risk of getting on to the articular surface.  These defects were in the medial malleolus and the anterolateral coronal were well filled and an additional, the remaining sponge was placed over top.  Final images showed excellent reduction of the bone alignment of the joint, appropriate hardware trajectory and length.  Montez Morita, PA-C did assist me throughout this very difficult procedure and could not have been accomplished without an assistant as he was required to pull traction of control alignment at all times.  He did assist with provisional and definitive fixation placement as well as these duties were alternated during repair.  He also assisted with wound closure.  It should be noted that prior to beginning that I did remove the external fixator after induction of general anesthesia.  The pin sites appeared to be in excellent condition and no debridements were required of the pins individually.  I irrigated them thoroughly, scrubbed the area with chlorhexidine scrub brush, but did not again debride them formally as they were in good overall condition.  The patient was taken to PACU after application of a gently sterile dressing, a posterior stirrup splint.  The tourniquet was deflated for 129 minutes.  PROGNOSIS:  Ryan Santiago has had a severe destruction of his articular surface, which was visible on the CAT scan suggested by the  reducible fracture dislocation.  He also had a tendon injury from entrapment of the posterior tibial tendon and this unfortunately was intact, but again injured and may produce posterior tibial insufficiency in the future, should it not recover appropriately and certainly arthritis, progressive loss of motion, and probable need fusion would be anticipated course for him, but we were hopeful with early regimen of motion and nonweightbearing that he can delay the onset of these symptoms for many years.  He remains at risk for wound breakdown and DVT.  He will be on pharmacologic prophylaxis.     Doralee Albino. Carola Frost, M.D.     MHH/MEDQ  D:  09/28/2012  T:  09/29/2012  Job:  [id number]

## 2012-09-29 NOTE — Progress Notes (Addendum)
Pt, per rept and per his admission, has a history of Von Willebrand disease. Pt repts, due to this condition, his "heart rate is always rapid". Pt heart rate regular rate ans rhythm. No s/sx cardiac distress and no c/o such. Pt heart rate runs from 120's to 130's at times especially with pain. Will continue to monitor.

## 2012-09-29 NOTE — Evaluation (Addendum)
Physical Therapy Evaluation Patient Details Name: Ryan Santiago MRN: [id number] DOB: 1980-06-10 Today's Date: 09/29/2012 Time: 7829-5621 PT Time Calculation (min): 29 min  PT Assessment / Plan / Recommendation Clinical Impression  Pt is a 32 y.o. male s/p external fixator removal on L LE. Presents with decreased indepdence with mobility, gt due to NWB status and pain in  L LE. Will benefit from skilled PT to maximize functional mobility. Will recommend cructhes and progress to knee walker when pain decreases. Anticipate D/C home with family tomorrow.    PT Assessment  Patient needs continued PT services    Follow Up Recommendations  Supervision - Intermittent (OPPT when mobility status changes)    Does the patient have the potential to tolerate intense rehabilitation      Barriers to Discharge None      Equipment Recommendations  Other (comment) (Crutches and Knee walker )    Recommendations for Other Services     Frequency Min 5X/week    Precautions / Restrictions Precautions Precautions: None Restrictions Weight Bearing Restrictions: Yes LLE Weight Bearing: Non weight bearing   Pertinent Vitals/Pain 8/10 in L LE; pt repositioned in bed with LE elevated.       Mobility  Bed Mobility Bed Mobility: Supine to Sit;Sitting - Scoot to Edge of Bed;Sit to Supine Supine to Sit: 6: Modified independent (Device/Increase time);HOB elevated;With rails Sitting - Scoot to Edge of Bed: 6: Modified independent (Device/Increase time) Sit to Supine: 6: Modified independent (Device/Increase time);With rail;HOB flat Details for Bed Mobility Assistance: pt demo good technique; requires increased time to complete tasks due to pain  Transfers Transfers: Sit to Stand;Stand to Sit Sit to Stand: 4: Min guard;From bed;From elevated surface Stand to Sit: 5: Supervision;To bed Details for Transfer Assistance: requires (A) for safety for intial sit to stand to steady; pt c/o light headedness  initially. required verbal cues for safety and hand placement  Ambulation/Gait Ambulation/Gait Assistance: 5: Supervision;4: Min guard Ambulation Distance (Feet): 10 Feet (x2) Assistive device: Crutches (knee walker ) Ambulation/Gait Assistance Details: pt intially wanted to attempt knee walker; pt was unsteady with knee walker and c/o pain in L LE; pt demo good techique with crutches required supervsion for safety and cues for sequencing and safety  Gait Pattern: Step-to pattern (hop to due to NWB status on  L LE ) Gait velocity: decreased due to NWB status on L LE and pain Stairs: No Wheelchair Mobility Wheelchair Mobility: No    Exercises     PT Diagnosis: Difficulty walking;Acute pain  PT Problem List: Decreased balance;Decreased knowledge of use of DME;Pain;Decreased mobility PT Treatment Interventions: DME instruction;Gait training;Functional mobility training;Therapeutic activities;Therapeutic exercise;Balance training;Neuromuscular re-education;Patient/family education   PT Goals Acute Rehab PT Goals PT Goal Formulation: With patient Time For Goal Achievement: 10/03/12 Potential to Achieve Goals: Good Pt will go Sit to Stand: with modified independence PT Goal: Sit to Stand - Progress: Goal set today Pt will go Stand to Sit: with modified independence PT Goal: Stand to Sit - Progress: Goal set today Pt will Ambulate: 51 - 150 feet;with modified independence;with least restrictive assistive device PT Goal: Ambulate - Progress: Goal set today  Visit Information  Last PT Received On: 09/29/12 Assistance Needed: +1 PT/OT Co-Evaluation/Treatment: Yes    Subjective Data  Subjective: pt lying supine; agreeable to therapy with wife present  Patient Stated Goal: home with wife and kids    Prior Functioning  Home Living Lives With: Spouse Available Help at Discharge: Family;Available 24 hours/day Type of  Home: House Home Access: Ramped entrance Home Layout: One  level Bathroom Shower/Tub: Tub/shower unit;Curtain Firefighter: Standard Bathroom Accessibility: Yes How Accessible: Accessible via wheelchair;Accessible via walker Home Adaptive Equipment: Shower chair with back;Tub transfer bench;Wheelchair - manual;Bedside commode/3-in-1 Additional Comments: Pt recently in hospital for external fixator on L LE; pt has became more indpendent while maintaining NWB status in past few weeks  Prior Function Level of Independence: Needs assistance Driving: Yes Vocation: Full time employment Comments: pt injured at work; fell off room Communication Communication: No difficulties Dominant Hand: Right    Cognition  Cognition Arousal/Alertness: Awake/alert Behavior During Therapy: WFL for tasks assessed/performed Overall Cognitive Status: Within Functional Limits for tasks assessed    Extremity/Trunk Assessment Right Upper Extremity Assessment RUE ROM/Strength/Tone: Within functional levels RUE Coordination: WFL - gross/fine motor Left Upper Extremity Assessment LUE ROM/Strength/Tone: Within functional levels LUE Coordination: WFL - gross/fine motor Right Lower Extremity Assessment RLE ROM/Strength/Tone: WFL for tasks assessed RLE Sensation: WFL - Light Touch Left Lower Extremity Assessment LLE ROM/Strength/Tone: Unable to fully assess;Due to pain;Due to precautions (unable to assess ankle; able to perform SLR ) LLE Sensation: WFL - Light Touch (on metatarsal and above cast ) Trunk Assessment Trunk Assessment: Normal   Balance Balance Balance Assessed: Yes Static Standing Balance Static Standing - Balance Support: Bilateral upper extremity supported;During functional activity Static Standing - Level of Assistance: 5: Stand by assistance  End of Session PT - End of Session Equipment Utilized During Treatment: Gait belt Activity Tolerance: Patient tolerated treatment well Patient left: in bed;with call bell/phone within reach;with  family/visitor present Nurse Communication: Mobility status  GP     [name], [place] [phone] 09/29/2012, 2:40 PM

## 2012-09-29 NOTE — Progress Notes (Signed)
[facility] Progress Note     1 Day Post-Op  Subjective   Doing better Pain improved overnight Wants to d/c pca Drinking a lot, minimal food intake No h/a No lightheadedness No n/v No CP or SOB No Abd pain    Objective  BP 112/69  Pulse 129  Temp(Src) 99.4 F (37.4 C) (Oral)  Resp 16  Ht 6' (1.829 m)  Wt 91.797 kg (202 lb 6 oz)  BMI 27.44 kg/m2  SpO2 96%   Intake/Output     05/27 0701 - 05/28 0700 05/28 0701 - 05/29 0700   P.O. 220    I.V. (mL/kg) 4446.3 (48.4)    Total Intake(mL/kg) 4666.3 (50.8)    Urine (mL/kg/hr) 1600 (0.7)    Blood 250 (0.1)    Total Output 1850     Net +2816.3            Labs Results for DELSIN, COPEN (MRN [id number]) as of 09/29/2012 08:45  Ref. Range 09/29/2012 05:45  Sodium Latest Range: 135-145 mEq/L 132 (L)  Potassium Latest Range: 3.5-5.1 mEq/L 4.1  Chloride Latest Range: 96-112 mEq/L 94 (L)  CO2 Latest Range: 19-32 mEq/L 24  BUN Latest Range: 6-23 mg/dL 12  Creatinine Latest Range: 0.50-1.35 mg/dL 4.78  Calcium Latest Range: 8.4-10.5 mg/dL 9.0  GFR calc non Af Amer Latest Range: >90 mL/min >90  GFR calc Af Amer Latest Range: >90 mL/min >90  Glucose Latest Range: 70-99 mg/dL 295 (H)  WBC Latest Range: 4.0-10.5 K/uL 17.3 (H)  RBC Latest Range: 4.22-5.81 MIL/uL 3.36 (L)  Hemoglobin Latest Range: 13.0-17.0 g/dL 62.1 (L)  HCT Latest Range: 39.0-52.0 % 31.7 (L)  MCV Latest Range: 78.0-100.0 fL 94.3  MCH Latest Range: 26.0-34.0 pg 32.4  MCHC Latest Range: 30.0-36.0 g/dL 30.8  RDW Latest Range: 11.5-15.5 % 13.1  Platelets Latest Range: 150-400 K/uL 198    Exam  Gen: awake and alert, NAD Lungs: clear B Cardiac: s1 and s2 Abd: +BS, NTND, soft Ext:            Left Lower Extremity  Splint c/d/i  Weak EHL  FHL and lesser toe motor function intact  Ext warm  No pain with passive stretch   Swelling stable  DPN, SPN, TN sensation intact    Assessment and Plan  1 Day Post-Op   32 y/o male s/p ORIF  complex L pilon fracture and fibula fx  1. Complex L pilon fracture s/p ORIF L tibia and fibula POD #1  NWB x 8 weeks  Splint x 2 weeks  Aggressive Ice and elevation  PT/OT  Toe, knee and hip ROM as tolerated   2. Medical issues   Stable   3. Pain management:  D/c PCA  PO meds  Reordered percocet 10/325, oxy IR component was discontinued. Reordered oxy IR component  4. DVT/PE prophylaxis:  Lovenox x 4 weeks  5. ID:   Completed post op abx course- vancomycin   6. Activity:  OOB as tolerated while maintaining activity restrictions  7. FEN/Foley/Lines:  Advance diet as tolerated    Hyponatremia- likely related to increased PO fluid intake and IVF    Will kvo IVF   D/c foley  Bowel regimen   8.Ex-fix/Splint care:  Do not remove splint  Do not get splint wet  Ice and elevate   9. Impediments to fracture healing:  Nicotine dependence  10. Dispo:  PT/OT consults  Transition to PO meds  Hopeful for d/c home tomorrow    Mellody Dance  Clarene Critchley, PA-C [facility] [phone] (P) 09/29/2012 8:44 AM

## 2012-09-30 ENCOUNTER — Inpatient Hospital Stay (HOSPITAL_COMMUNITY): Payer: Worker's Compensation

## 2012-09-30 ENCOUNTER — Encounter (HOSPITAL_COMMUNITY): Payer: Self-pay | Admitting: Nurse Practitioner

## 2012-09-30 DIAGNOSIS — R000 Tachycardia, unspecified: Secondary | ICD-10-CM | POA: Clinically undetermined

## 2012-09-30 DIAGNOSIS — F17200 Nicotine dependence, unspecified, uncomplicated: Secondary | ICD-10-CM

## 2012-09-30 DIAGNOSIS — I495 Sick sinus syndrome: Secondary | ICD-10-CM

## 2012-09-30 DIAGNOSIS — D680 Von Willebrand's disease: Secondary | ICD-10-CM

## 2012-09-30 LAB — BASIC METABOLIC PANEL
BUN: 12 mg/dL (ref 6–23)
Chloride: 95 mEq/L — ABNORMAL LOW (ref 96–112)
GFR calc Af Amer: >90 mL/min (ref >90)
GFR calc non Af Amer: >90 mL/min (ref >90)
Potassium: 3.8 mEq/L (ref 3.5–5.1)
Sodium: 134 mEq/L — ABNORMAL LOW (ref 135–145)

## 2012-09-30 LAB — TSH: TSH: 3.331 u[IU]/mL (ref 0.350–4.500)

## 2012-09-30 LAB — CBC
HCT: 30.2 % — ABNORMAL LOW (ref 39.0–52.0)
Hemoglobin: 10.3 g/dL — ABNORMAL LOW (ref 13.0–17.0)
RBC: 3.15 MIL/uL — ABNORMAL LOW (ref 4.22–5.81)
WBC: 15 10*3/uL — ABNORMAL HIGH (ref 4.0–10.5)

## 2012-09-30 MED ORDER — LORAZEPAM 0.5 MG PO TABS
0.5000 mg | ORAL_TABLET | Freq: Once | ORAL | Status: DC
  Filled 2012-09-30: qty 1

## 2012-09-30 MED ORDER — IOHEXOL 350 MG/ML SOLN
100.0000 mL | Freq: Once | INTRAVENOUS | Status: AC | PRN
  Administered 2012-09-30: 100 mL via INTRAVENOUS

## 2012-09-30 MED ORDER — LORAZEPAM 0.5 MG PO TABS
0.5000 mg | ORAL_TABLET | Freq: Two times a day (BID) | ORAL | Status: DC | PRN
  Administered 2012-09-30 (×2): 0.5 mg via ORAL
  Filled 2012-09-30: qty 1

## 2012-09-30 MED ORDER — ACETAMINOPHEN 325 MG PO TABS
650.0000 mg | ORAL_TABLET | Freq: Four times a day (QID) | ORAL | Status: DC | PRN
  Filled 2012-09-30: qty 2

## 2012-09-30 NOTE — Progress Notes (Signed)
UR COMPLETED  

## 2012-09-30 NOTE — Care Management Note (Signed)
CARE MANAGEMENT NOTE 09/30/2012  Patient:  Ryan Santiago, Ryan Santiago   Account Number:  [id number]  Date Initiated:  09/30/2012  Documentation initiated by:  Vance Peper  Subjective/Objective Assessment:   32 yr old male s/p removal of external fixator and ORIF of left tib/fib.     Action/Plan:   CM spoke with patient and wife. Patient is under worker's comp. Adj. is Chuck-[phone], Fax: [phone].  CM faxed orders and left voicemail message.   Anticipated DC Date:  10/01/2012   Anticipated DC Plan:  HOME W HOME HEALTH SERVICES      DC Planning Services  CM consult      PAC Choice  DURABLE MEDICAL EQUIPMENT  HOME HEALTH   Choice offered to / List presented to:     DME arranged  WALKER      DME agency  OTHER - SEE NOTE     HH arranged  HH-2 PT      HH agency  OTHER - SEE NOTE   Status of service:  In process, will continue to follow Medicare Important Message given?   (If response is "NO", the following Medicare IM given date fields will be blank) Date Medicare IM given:   Date Additional Medicare IM given:    Discharge Disposition:  HOME W HOME HEALTH SERVICES  Per UR Regulation:    If discussed at Long Length of Stay Meetings, dates discussed:    Comments:  09/30/12 1430 Vance Peper, RN BSN NCM CM faxed orders for Knee walker to  [facility] [phone], phone: [phone]. Knee walker will be delivered to patient's home. Waiting for adjuster to call regarding selected home health agency.

## 2012-09-30 NOTE — Consult Note (Signed)
CARDIOLOGY CONSULT NOTE  Patient ID: Ryan Santiago MRN: [id number], DOB/AGE: [birth year]   Admit date: 09/28/2012 Date of Consult: 09/30/2012   Primary Physician: None Primary Cardiologist: new to  - seen by P. [place], MD   Pt. Profile  32 y/o male without prior cardiac hx whom we've been asked to eval 2/2 persistent sinus tachycardia post-op ORIF L ankle.  Problem List  Past Medical History  Diagnosis Date  . Von Willebrand disease   . Closed left ankle fracture     a. 08/2012: Fall from ladder->Left pilon dislocation and poss compartment syndrome->CRIF, closed manipulation/reduction of pilon/tib/fib, ext fixator, fasciotomies;  b. 09/2012 ORIF dist tib/fib.  . Sinus tachycardia   . Tobacco abuse     Past Surgical History  Procedure Laterality Date  . Cystic hygroma excision  2001    small intestinal removal; "benign" (09/28/2012)  . External fixation leg Left 08/28/2012    Procedure: EXTERNAL FIXATION Left Tibia fuibula fracture;  Surgeon: Budd Palmer, MD;  Location: [facility] OR;  Service: Orthopedics;  Laterality: Left;  . Dorsal compartment release Left 08/28/2012    Procedure: RELEASE DORSAL COMPARTMENT (DEQUERVAIN);  Surgeon: Budd Palmer, MD;  Location: [facility] OR;  Service: Orthopedics;  Laterality: Left;  . Application of wound vac Left 08/28/2012    Procedure: APPLICATION OF WOUND VAC;  Surgeon: Budd Palmer, MD;  Location: [facility] OR;  Service: Orthopedics;  Laterality: Left;; (out on 08/31/2012) (09/28/2012)  . Fasciotomy Left 08/30/2012    Procedure: FASCIOTOMY CLOSURE LEFT LEG;  Surgeon: Budd Palmer, MD;  Location: [facility] OR;  Service: Orthopedics;  Laterality: Left;  . Open reduction internal fixation (orif) tibia/fibula fracture Left 09/28/2012    REMOVING EXTERNAL FIXATOR   . Knee arthroscopy Right 04/2009  . Orif ankle fracture Left 09/28/2012    Procedure: OPEN REDUCTION INTERNAL FIXATION (ORIF) DISTAL TIBIA FRACTURE, REMOVING EXTERNAL FIXATOR;  Surgeon: Budd Palmer, MD;  Location: [facility] OR;  Service: Orthopedics;  Laterality: Left;    Allergies  Allergies  Allergen Reactions  . Morphine And Related Anaphylaxis    Arm became swollen, ultimately intubated  . Dilaudid (Hydromorphone Hcl)     Hallucinations and disorientation  . Penicillins Hives   HPI   32 y/o male without prior cardiac hx.  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  This was followed by pain and deformity.  EMS was called and and he was taken to [facility] ED where he was dx with L pilon and dist tib/fib fx's.  He was transferred to [facility] and underwent urgent external fixation and fasciotomy.  Uncomplicated post-op course and he was d/c'd on 4/29 with plan for outpt f/u and eventual ORIF.  He's done reasonably well over the past month and was admitted 4/27 for ORIF.  Post-operatively, he has been having LLE pain and in that setting has also had persistent sinus tachycardia with rates in the 120's to 130's.  He denies chest pain, sob, pnd, orthopnea, n, v, dizziness, or syncope.  His BP has been stable.  He is on oral narcotics for pain, though he says that he remains in pain.  We've been asked to eval.  He is scheduled for CTA to r/o PE today.  Inpatient Medications  . cyclobenzaprine  10 mg Oral TID  . docusate sodium  100 mg Oral BID  .  enoxaparin (LOVENOX) injection  40 mg Subcutaneous Q24H  . LORazepam  0.5 mg Oral Once  . pantoprazole  40 mg Oral Daily  . polyethylene glycol  17 g Oral Daily  . pregabalin  75 mg Oral TID   Family History Family History  Problem Relation Age of Onset  . Ovarian cancer Mother     alive - early 49's  . Other Father     alive and well.    Social History History   Social History  . Marital Status: Single    Spouse Name: N/A    Number of Children: N/A  . Years of Education: N/A   Occupational History  . Not on  file.   Social History Main Topics  . Smoking status: Current Every Day Smoker -- 1.00 packs/day for 16 years    Types: Cigarettes  . Smokeless tobacco: Never Used  . Alcohol Use: No  . Drug Use: No  . Sexually Active: Yes   Other Topics Concern  . Not on file   Social History Narrative   Lives in [place] with his wife and 5 dtrs - 1.5 to 61.  Does not routinely exercise.  Works as a Nutritional therapist.    Review of Systems  General:  No chills, fever, night sweats or weight changes.  Cardiovascular:  No chest pain, dyspnea on exertion, edema, orthopnea, palpitations, paroxysmal nocturnal dyspnea. Dermatological: No rash, lesions/masses Respiratory: No cough, dyspnea Urologic: No hematuria, dysuria Abdominal:   No nausea, vomiting, diarrhea, bright red blood per rectum, melena, or hematemesis Neurologic:  No visual changes, wkns, changes in mental status. MSK:  Left leg pain. All other systems reviewed and are otherwise negative except as noted above.  Physical Exam  Blood pressure 117/74, pulse 132, temperature 99.9 F (37.7 C), temperature source Oral, resp. rate 18, height 6' (1.829 m), weight 202 lb 6 oz (91.797 kg), SpO2 93.00%.  General: Pleasant, NAD Psych: Normal affect. Neuro: Alert and oriented X 3. Moves all extremities spontaneously. HEENT: Normal  Neck: Supple without bruits or JVD. Lungs:  Resp regular and unlabored, CTA. Heart: RRR tachy, no s3, s4, or murmurs. Abdomen: Soft, non-tender, non-distended, BS + x 4.  Extremities: No clubbing, cyanosis or edema. DP/PT 2+ on right.  Cast to LLE. Labs   Lab Results  Component Value Date   WBC 15.0* 09/30/2012   HGB 10.3* 09/30/2012   HCT 30.2* 09/30/2012   MCV 95.9 09/30/2012   PLT 191 09/30/2012    Recent Labs Lab 09/28/12 1646  09/30/12 0430  NA 133*  < > 134*  K 4.9  < > 3.8  CL 97  < > 95*  CO2 20  < > 30  BUN 19  < > 12  CREATININE 0.90  < > 1.05  CALCIUM 9.3  < > 9.6  PROT 6.3  --   --   BILITOT 0.3   --   --   ALKPHOS 113  --   --   ALT 80*  --   --   AST 43*  --   --   GLUCOSE 140*  < > 117*  < > = values in this interval not displayed.  Radiology/Studies  Dg Ankle Left Port  09/28/2012   *RADIOLOGY REPORT*  Clinical Data: Postop ORIF left ankle  PORTABLE LEFT ANKLE - 2 VIEW  IMPRESSION: Status post ORIF of the distal tibia and fibula, as described above.   Original Report Authenticated By: Charline Bills, M.D.   ECG  Sinus tach, 138, inf q's, no acute st/t changes.  ASSESSMENT AND PLAN  1.  Sinus Tachycardia:  Likely multifactorial in setting of pain and mild anemia.  ? Nicotine withdrawal as well.  He was not tachycardic following his initial surgery in April.  He has had no chest pain or dyspnea.  CTA pending to r/o PE.  Will check a TSH.  Provided that CTA is nl, he can be discharged from a cardiac standpoint.  2.  Tob Abuse:  Cessation advised.    3.  L Ankle Fx:  Per ortho.  4.  VWD:  No active bleeding.  He should have outpt primary care f/u.  Signed, Nicolasa Ducking, NP 09/30/2012, 2:45 PM  Patient seen and examined and history reviewed. Agree with above findings and plan. 32 yo WM with history of tobacco abuse but otherwise healthy. He fractured his ankle one month ago. Admitted for internal fixation. Post op was noted to have sinus tachycardia. Ecg is otherwise normal. No symptoms of chest pain or SOB. Tachycardia is most likely due to physical stress with pain and anemia. Will check TSH. Agree if CTA is negative can discharge home. No further cardiac workup needed.  Theron [facility] 09/30/2012 3:04 PM

## 2012-09-30 NOTE — Progress Notes (Signed)
Orthopedic Tech Progress Note Patient Details:  Ryan Santiago 1981-02-17 [id number] Crutches ordered for patient use at home.  Ortho Devices Type of Ortho Device: Crutches Ortho Device/Splint Interventions: Ordered   [place] 09/30/2012, 8:23 AM

## 2012-09-30 NOTE — Progress Notes (Signed)
Ortho follow up note  S:  Pt continues to do well  Recheck of HR is 132, remains regular  Still no complaints of CP, SOB  Pain controlled 4-5/10  O:  BP 117/74  Pulse 132  Temp(Src) 99.9 F (37.7 C) (Oral)  Resp 18  Ht 6' (1.829 m)  Wt 91.797 kg (202 lb 6 oz)  BMI 27.44 kg/m2  SpO2 93%   Gen: awake, alert, NAD, appears comfortable Cardiac: reg, s1 and s2, tachy Lungs: clear Ext:  Exam unchanged  12 lead EKG  Sinus tach, no appreciable changes from previous EKG in April  A:  Persistent tachycardia  P:  Will check CTA of chest to eval for PE  Pt has been on Lovenox since last admission as well  Cardiology consult  Likely physiologic tachycardia in post op state  Again does not appear to be volume depleted and labs are stable including electrolytes  WBC count elevated, no fever or other signs of infection noted  Continue to monitor  Hold d/c  Mearl Latin, PA-C [facility] [phone] (P) 09/30/2012 1:29 PM

## 2012-09-30 NOTE — Progress Notes (Signed)
Physical Therapy Treatment Patient Details Name: Ryan Santiago MRN: [id number] DOB: 12-01-1980 Today's Date: 09/30/2012 Time: 2952-8413 PT Time Calculation (min): 14 min  PT Assessment / Plan / Recommendation Comments on Treatment Session  Pt progressing well with therapy. Is safe to D/C with wife when medically stable. Pt can be impulsive at times. is at supervision to mod I level.  Will benefit from OPPT when NWB status is lifted.    Follow Up Recommendations  Supervision - Intermittent     Does the patient have the potential to tolerate intense rehabilitation     Barriers to Discharge  None      Equipment Recommendations  Other (Knee Walker and crutches)   Recommendations for Other Services    Frequency Min 5X/week   Plan Discharge plan remains appropriate;Frequency remains appropriate    Precautions / Restrictions Precautions Precautions: None Restrictions Weight Bearing Restrictions: Yes LLE Weight Bearing: Non weight bearing   Pertinent Vitals/Pain 8/10; RN notified.    Mobility  Bed Mobility Bed Mobility: Supine to Sit;Sitting - Scoot to Edge of Bed;Sit to Supine Supine to Sit: 6: Modified independent (Device/Increase time);HOB elevated;With rails Sitting - Scoot to Edge of Bed: 6: Modified independent (Device/Increase time) Sit to Supine: 6: Modified independent (Device/Increase time);With rail;HOB flat Details for Bed Mobility Assistance: pt demo good technique; requires increased time to complete tasks due to pain  Transfers Transfers: Sit to Stand;Stand to Sit Sit to Stand: 5: Supervision;From bed Stand to Sit: To bed;6: Modified independent (Device/Increase time) Details for Transfer Assistance: supervision for safety; requires increased time due to pain  Ambulation/Gait Ambulation/Gait Assistance: 5: Supervision Ambulation Distance (Feet): 10 Feet (x2) Assistive device: Crutches Ambulation/Gait Assistance Details: pt is impulsive and demo safety  awareness deficits at times; is unreceptive to cues and instructions at times; will complete tasks and amb his own way  Gait Pattern: Step-to pattern Gait velocity: decreased due to NWB status on L LE and pain Stairs: No Wheelchair Mobility Wheelchair Mobility: No    Exercises General Exercises - Lower Extremity Long Arc Quad: AROM;Left;10 reps;Seated Hip Flexion/Marching: AROM;Left;10 reps;Seated   PT Diagnosis:    PT Problem List:   PT Treatment Interventions:     PT Goals Acute Rehab PT Goals PT Goal Formulation: With patient Time For Goal Achievement: 10/03/12 Potential to Achieve Goals: Good PT Goal: Sit to Stand - Progress: Progressing toward goal PT Goal: Stand to Sit - Progress: Met PT Goal: Ambulate - Progress: Progressing toward goal  Visit Information  Last PT Received On: 09/30/12 Assistance Needed: +1    Subjective Data  Subjective: pt lying supine with wife present; agreeable to therapy today. states he is going home today  Patient Stated Goal: home with wife and kids    Cognition  Cognition Arousal/Alertness: Awake/alert Behavior During Therapy: WFL for tasks assessed/performed Overall Cognitive Status: Within Functional Limits for tasks assessed    Balance  Balance Balance Assessed: No  End of Session PT - End of Session Equipment Utilized During Treatment: Gait belt Activity Tolerance: Patient tolerated treatment well Patient left: in bed;with bed alarm set;with family/visitor present Nurse Communication: Mobility status   GP     [name], [place] [phone] 09/30/2012, 12:03 PM

## 2012-09-30 NOTE — Progress Notes (Signed)
[facility] Progress Note     2 Days Post-Op  Subjective   Doing great No complaints  Denies CP, SOB No cough  No lightheadedness or dizziness  Wants to go home    Objective  BP 126/70  Pulse 125  Temp(Src) 99.4 F (37.4 C) (Oral)  Resp 18  Ht 6' (1.829 m)  Wt 91.797 kg (202 lb 6 oz)  BMI 27.44 kg/m2  SpO2 93%  Patient Vitals for the past 24 hrs:  BP Temp Temp src Pulse Resp SpO2  09/30/12 0556 126/70 mmHg 99.4 F (37.4 C) Oral 125 18 93 %  09/29/12 2035 149/84 mmHg 101 F (38.3 C) Oral 153 18 94 %  09/29/12 1300 127/68 mmHg 99 F (37.2 C) - 136 20 97 %    Intake/Output     05/28 0701 - 05/29 0700 05/29 0701 - 05/30 0700   P.O. 600    I.V. (mL/kg)     Total Intake(mL/kg) 600 (6.5)    Urine (mL/kg/hr) 2175 (1)    Blood     Total Output 2175     Net -1575            Labs Results for ECTOR, LAUREL (MRN [id number]) as of 09/30/2012 08:55  Ref. Range 09/30/2012 04:30  Sodium Latest Range: 135-145 mEq/L 134 (L)  Potassium Latest Range: 3.5-5.1 mEq/L 3.8  Chloride Latest Range: 96-112 mEq/L 95 (L)  CO2 Latest Range: 19-32 mEq/L 30  BUN Latest Range: 6-23 mg/dL 12  Creatinine Latest Range: 0.50-1.35 mg/dL 4.09  Calcium Latest Range: 8.4-10.5 mg/dL 9.6  GFR calc non Af Amer Latest Range: >90 mL/min >90  GFR calc Af Amer Latest Range: >90 mL/min >90  Glucose Latest Range: 70-99 mg/dL 811 (H)  WBC Latest Range: 4.0-10.5 K/uL 15.0 (H)  RBC Latest Range: 4.22-5.81 MIL/uL 3.15 (L)  Hemoglobin Latest Range: 13.0-17.0 g/dL 91.4 (L)  HCT Latest Range: 39.0-52.0 % 30.2 (L)  MCV Latest Range: 78.0-100.0 fL 95.9  MCH Latest Range: 26.0-34.0 pg 32.7  MCHC Latest Range: 30.0-36.0 g/dL 78.2  RDW Latest Range: 11.5-15.5 % 13.1  Platelets Latest Range: 150-400 K/uL 191    Exam  Gen: awake and alert, resting comfortably in bed, NAD Lungs: clear B Cardiac: s1 and s2, reg, tachy Abd: + BS, NT Ext:            Left Lower Extremity  Splint c/d/i           Weak EHL             FHL and lesser toe motor function intact             Ext warm             No pain with passive stretch               Swelling stable             DPN, SPN, TN sensation intact     Assessment and Plan  2 Days Post-Op  32 y/o male s/p ORIF complex L pilon fracture and fibula fx  1. Complex L pilon fracture s/p ORIF L tibia and fibula POD #2             NWB x 8 weeks             Splint x 2 weeks             Aggressive Ice and elevation  PT/OT             Toe, knee and hip ROM as tolerated              2. Medical issues               Stable  3. Tachycardia  Will check 12 lead EKG  Pt without cardiopulmonary symptoms  Likely physiologic from pain and surgery, possible anxiety component as well  Monitor   No evidence of hypovolemia/symptomatic blood loss  Recheck vitals, consider CTA of chest   4. Pain management:                          PO meds             percocet 10/325  Oxy IR  Flexeril  Low dose ativan q 12 prn  4. DVT/PE prophylaxis:             Lovenox x 4 weeks  5. ID:               Completed post op abx course- vancomycin   6. Activity:             OOB as tolerated while maintaining activity restrictions  7. FEN/Foley/Lines:             Advance diet as tolerated                          Hyponatremia- improved              voiding well              Bowel regimen   8.Ex-fix/Splint care:             Do not remove splint             Do not get splint wet             Ice and elevate   9. Impediments to fracture healing:             Nicotine dependence  10. Dispo:             PT/OT   Re-eval this pm  Possible home this afternoon    Mearl Latin, PA-C [facility] [phone] (P) 09/30/2012 8:54 AM

## 2012-10-01 DIAGNOSIS — J9811 Atelectasis: Secondary | ICD-10-CM

## 2012-10-01 LAB — BASIC METABOLIC PANEL
CO2: 26 mEq/L (ref 19–32)
Calcium: 9.5 mg/dL (ref 8.4–10.5)
Glucose, Bld: 129 mg/dL — ABNORMAL HIGH (ref 70–99)
Potassium: 3.8 mEq/L (ref 3.5–5.1)
Sodium: 134 mEq/L — ABNORMAL LOW (ref 135–145)

## 2012-10-01 MED ORDER — BUPROPION HCL ER (SR) 150 MG PO TB12: 150.0000 mg | ORAL_TABLET | Freq: Two times a day (BID) | ORAL | Status: DC

## 2012-10-01 MED ORDER — CYCLOBENZAPRINE HCL 10 MG PO TABS: 10.0000 mg | ORAL_TABLET | Freq: Three times a day (TID) | ORAL | Status: DC

## 2012-10-01 MED ORDER — PREGABALIN 75 MG PO CAPS: 75.0000 mg | ORAL_CAPSULE | Freq: Three times a day (TID) | ORAL | Status: DC

## 2012-10-01 MED ORDER — OXYCODONE HCL 5 MG PO TABS: 5.0000 mg | ORAL_TABLET | ORAL | Status: DC | PRN

## 2012-10-01 MED ORDER — OXYCODONE-ACETAMINOPHEN 10-325 MG PO TABS: 1.0000 | ORAL_TABLET | Freq: Four times a day (QID) | ORAL | Status: DC | PRN

## 2012-10-01 MED ORDER — DSS 100 MG PO CAPS: 100.0000 mg | ORAL_CAPSULE | Freq: Two times a day (BID) | ORAL | Status: DC

## 2012-10-01 MED ORDER — ENOXAPARIN SODIUM 40 MG/0.4ML ~~LOC~~ SOLN: 40.0000 mg | SUBCUTANEOUS | Status: DC

## 2012-10-01 NOTE — Progress Notes (Signed)
[facility] Progress Note     3 Days Post-Op  Subjective   Ready to go home No new issues No CP, SOB No dizziness, lightheadedness  Mobilizing on own in room without difficulty  Wants to quit smoking   Objective  BP 141/86  Pulse 121  Temp(Src) 99.3 F (37.4 C) (Oral)  Resp 18  Ht 6' (1.829 m)  Wt 91.797 kg (202 lb 6 oz)  BMI 27.44 kg/m2  SpO2 99%  Patient Vitals for the past 24 hrs:  BP Temp Temp src Pulse Resp SpO2  10/01/12 0532 141/86 mmHg 99.3 F (37.4 C) - 121 18 99 %  09/30/12 2100 138/65 mmHg 101.5 F (38.6 C) - 129 18 93 %  09/30/12 1449 121/53 mmHg 99.1 F (37.3 C) - 123 18 96 %  09/30/12 1139 117/74 mmHg 99.9 F (37.7 C) Oral 132 - 93 %    Intake/Output     05/29 0701 - 05/30 0700 05/30 0701 - 05/31 0700   P.O. 480    Total Intake(mL/kg) 480 (5.2)    Urine (mL/kg/hr) 975 (0.4)    Total Output 975     Net -495          Urine Occurrence 1 x      Labs Results for Ryan Santiago, Ryan Santiago (MRN [id number]) as of 10/01/2012 10:08  Ref. Range 10/01/2012 05:19  Sodium Latest Range: 135-145 mEq/L 134 (L)  Potassium Latest Range: 3.5-5.1 mEq/L 3.8  Chloride Latest Range: 96-112 mEq/L 95 (L)  CO2 Latest Range: 19-32 mEq/L 26  BUN Latest Range: 6-23 mg/dL 13  Creatinine Latest Range: 0.50-1.35 mg/dL 4.09  Calcium Latest Range: 8.4-10.5 mg/dL 9.5  GFR calc non Af Amer Latest Range: >90 mL/min >90  GFR calc Af Amer Latest Range: >90 mL/min >90  Glucose Latest Range: 70-99 mg/dL 811 (H)   Results for Ryan Santiago, Ryan Santiago (MRN [id number]) as of 10/01/2012 10:08  Ref. Range 09/30/2012 15:50  TSH Latest Range: 0.350-4.500 uIU/mL 3.331    Exam   Gen: awake and alert, resting comfortably in bed, NAD Lungs: clear B Cardiac: s1 and s2, reg, tachy Abd: + BS, NT Ext:             Left Lower Extremity             Splint c/d/i             Weak EHL             FHL and lesser toe motor function intact             Ext warm             No pain with passive  stretch               Swelling stable             DPN, SPN, TN sensation intact   Imaging  CTA chest- negative for acute PE.  Notable for B ATX  Assessment and Plan  3 Days Post-Op  32 y/o male s/p ORIF complex L pilon fracture and fibula fx  1. Complex L pilon fracture s/p ORIF L tibia and fibula POD #23             NWB x 8 weeks             Splint x 2 weeks             Aggressive Ice and elevation  PT/OT             Toe, knee and hip ROM as tolerated              2. Medical issues               Stable  3. Tachycardia  Workup negative for PE  TSH in normal range   Appears to be Sinus tach  Likely related to pain and post op state  Stable for d/c   Appreciate cards input             4. Pain management:                          PO meds             percocet 10/325             Oxy IR             Flexeril             Low dose ativan q 12 prn  4. DVT/PE prophylaxis:             Lovenox x 4 weeks  5. ID:               Completed post op abx course- vancomycin   6. Activity:             OOB as tolerated while maintaining activity restrictions  7. FEN/Foley/Lines:             Advance diet as tolerated                          Hyponatremia- improved              voiding well               Bowel regimen   8.Ex-fix/Splint care:             Do not remove splint             Do not get splint wet             Ice and elevate   9. Impediments to fracture healing:             Nicotine dependence  10. Dispo:            D/c home today  Follow up in 14 days at office    Will write for wellbutrin SR as pt is interested in smoking cessation   Mearl Latin, PA-C [facility] [phone] (P) 10/01/2012 10:07 AM

## 2012-10-01 NOTE — Progress Notes (Signed)
Patient discharged in stable condition via wheelchair home. Discharge instructions and prescriptions were given and explained. 

## 2012-10-01 NOTE — Discharge Summary (Signed)
[facility]thopaedic Trauma Service (OTS)  Patient ID: Ryan Santiago MRN: [id number] DOB/AGE: 34-16-1982 34 y.o.  Admit date: 09/28/2012 Discharge date: 10/01/2012  Admission Diagnoses: Closed L pilon fx s/p ex fix 08/28/2012 Nicotine dependence Von Willebrand's disease   Discharge Diagnoses:  Principal Problem:   Closed pilon fracture of left tibia Active Problems:   Nicotine dependence   Von Willebrand disease   Sinus tachycardia   Atelectasis   Procedures Performed: 09/28/2012- Dr. Carola Frost   1. ORIF Left tibial pilon fracture, tibia and fibula  2. Removal of external fixator under anesthesia  Discharged Condition: good  Hospital Course:   Patient is a 33 year old white male well-known to the orthopedic trauma service after sustaining a fall off a ladder back in April. Do to be extensive damage patient was placed into a spanning external fixator. As is customary with pilon fractures he underwent a period of soft tissue rest to allow for adequate resolution of the swelling. This did take 4 weeks to occur to allow for safe a surgical passage. Patient presented for surgery on 09/28/2012 for removal of his external fixator and ORIF of his complex left distal tibia and fibula fractures. Patient tolerated procedure well. At surgery he was transferred to the PACU for recovery from anesthesia and then transferred to the orthopedic floor for observation, pain control and to begin therapies. On postoperative day #1 the patient was doing well he did have a fentanyl PCA postoperatively but wished to discontinue his PCA on postop day 1. He was getting good relief from his oral pain medication at the time. Patient was also restarted on Lovenox for DVT and PE prophylaxis. He started physical therapy on postoperative day #1 and was mobilizing well. On postoperative day #2 patient did continue to have some pain control issues but most concerning was his elevated heart rate. Overnight postop day 1 into  postop day 2 he was noted to have a heart rate that went up to 152 but on evaluation in the morning he was down in the 130s. Patient denied any chest pain or other cardiopulmonary symptoms. However given his trauma history as well as immobility we did decide to proceed with a CT angiogram to evaluate for acute PE. This was negative. EKG was also negative for any acute cardiac issues. It demonstrated sinus tachycardia.  We did also obtain a cardiology consult. Patient did not have any cardiac history and again did not demonstrate any cardiopulmonary symptoms. It was felt that his tachycardia which was indeed sinus tachycardia was related to have pain as well as postoperative state. The patient did have some mild anemia as well. But overall his volume status appeared to be maintained.  On postoperative day #3 patient continued to do well no complaints and stated that he is ready to go home. Patient was mobilizing well. Pain control was adequate and his heart rate on examination was 116. Again no chest pain or shortness of breath. No lightheadedness or other concerning symptoms. Patient was discharged on postoperative day #3 in stable condition. He was tolerating oral diet, voiding on his own. The patient also did voice great interest in smoking cessation. We did talk about multiple options and decided that we would use Wellbutrin SR as an adjuvant to his smoking cessation program.   Consults: cardiology  Significant Diagnostic Studies: labs:  Results for Ryan Santiago, Ryan Santiago (MRN [id number]) as of 10/01/2012 10:08   Ref. Range  10/01/2012 05:19   Sodium  Latest Range: 135-145  mEq/L  134 (L)   Potassium  Latest Range: 3.5-5.1 mEq/L  3.8   Chloride  Latest Range: 96-112 mEq/L  95 (L)   CO2  Latest Range: 19-32 mEq/L  26   BUN  Latest Range: 6-23 mg/dL  13   Creatinine  Latest Range: 0.50-1.35 mg/dL  8.29   Calcium  Latest Range: 8.4-10.5 mg/dL  9.5   GFR calc non Af Amer  Latest Range: >90 mL/min  >90   GFR  calc Af Amer  Latest Range: >90 mL/min  >90   Glucose  Latest Range: 70-99 mg/dL  562 (H)    Results for Ryan Santiago, Ryan Santiago (MRN [id number]) as of 10/01/2012 10:08   Ref. Range  09/30/2012 15:50   TSH  Latest Range: 0.350-4.500 uIU/mL  3.331    Results for Ryan Santiago, Ryan Santiago (MRN [id number]) as of 10/01/2012 10:08  Ref. Range 09/30/2012 04:30  WBC Latest Range: 4.0-10.5 K/uL 15.0 (H)  RBC Latest Range: 4.22-5.81 MIL/uL 3.15 (L)  Hemoglobin Latest Range: 13.0-17.0 g/dL 13.2 (L)  HCT Latest Range: 39.0-52.0 % 30.2 (L)  MCV Latest Range: 78.0-100.0 fL 95.9  MCH Latest Range: 26.0-34.0 pg 32.7  MCHC Latest Range: 30.0-36.0 g/dL 44.0  RDW Latest Range: 11.5-15.5 % 13.1  Platelets Latest Range: 150-400 K/uL 191   CT angiogram of chest: Negative for acute pulmonary embolus. Notable for atelectasis bilaterally  Treatments: IV hydration, antibiotics: vancomycin, analgesia: acetaminophen, fentanyl PCA, Percocet, OxyIR, anticoagulation: LMW heparin, therapies: PT, OT and RN and surgery: As above  Discharge Exam:  Orthopaedic Trauma Service Progress Note                                          3 Days Post-Op  Subjective   Ready to go home No new issues No CP, SOB No dizziness, lightheadedness  Mobilizing on own in room without difficulty  Wants to quit smoking   Objective  BP 141/86  Pulse 121  Temp(Src) 99.3 F (37.4 C) (Oral)  Resp 18  Ht 6' (1.829 m)  Wt 91.797 kg (202 lb 6 oz)  BMI 27.44 kg/m2  SpO2 99%  Patient Vitals for the past 24 hrs:   BP  Temp  Temp src  Pulse  Resp  SpO2   10/01/12 0532  141/86 mmHg  99.3 F (37.4 C)  -  121  18  99 %   09/30/12 2100  138/65 mmHg  101.5 F (38.6 C)  -  129  18  93 %   09/30/12 1449  121/53 mmHg  99.1 F (37.3 C)  -  123  18  96 %   09/30/12 1139  117/74 mmHg  99.9 F (37.7 C)  Oral  132  -  93 %     Intake/Output     05/29 0701 - 05/30 0700 05/30 0701 - 05/31 0700    P.O. 480     Total Intake(mL/kg) 480 (5.2)     Urine  (mL/kg/hr) 975 (0.4)     Total Output 975      Net -495            Urine Occurrence 1 x       Labs Results for Ryan Santiago, Ryan Santiago (MRN [id number]) as of 10/01/2012 10:08   Ref. Range  10/01/2012 05:19   Sodium  Latest Range: 135-145 mEq/L  134 (L)   Potassium  Latest Range:  3.5-5.1 mEq/L  3.8   Chloride  Latest Range: 96-112 mEq/L  95 (L)   CO2  Latest Range: 19-32 mEq/L  26   BUN  Latest Range: 6-23 mg/dL  13   Creatinine  Latest Range: 0.50-1.35 mg/dL  1.61   Calcium  Latest Range: 8.4-10.5 mg/dL  9.5   GFR calc non Af Amer  Latest Range: >90 mL/min  >90   GFR calc Af Amer  Latest Range: >90 mL/min  >90   Glucose  Latest Range: 70-99 mg/dL  096 (H)    Results for Ryan Santiago, Ryan Santiago (MRN [id number]) as of 10/01/2012 10:08   Ref. Range  09/30/2012 15:50   TSH  Latest Range: 0.350-4.500 uIU/mL  3.331     Exam   Gen: awake and alert, resting comfortably in bed, NAD Lungs: clear B Cardiac: s1 and s2, reg, tachy Abd: + BS, NT Ext:             Left Lower Extremity             Splint c/d/i             Weak EHL             FHL and lesser toe motor function intact             Ext warm             No pain with passive stretch               Swelling stable             DPN, SPN, TN sensation intact   Imaging  CTA chest- negative for acute PE.  Notable for B ATX  Assessment and Plan  3 Days Post-Op  32 y/o male s/p ORIF complex L pilon fracture and fibula fx  1. Complex L pilon fracture s/p ORIF L tibia and fibula POD #23             NWB x 8 weeks             Splint x 2 weeks             Aggressive Ice and elevation             PT/OT             Toe, knee and hip ROM as tolerated              2. Medical issues               Stable  3. Tachycardia             Workup negative for PE             TSH in normal range               Appears to be Sinus tach             Likely related to pain and post op state             Stable for d/c               Appreciate cards input              4. Pain management:                          PO meds  percocet 10/325             Oxy IR             Flexeril             Low dose ativan q 12 prn  4. DVT/PE prophylaxis:             Lovenox x 4 weeks  5. ID:               Completed post op abx course- vancomycin   6. Activity:             OOB as tolerated while maintaining activity restrictions  7. FEN/Foley/Lines:             Advance diet as tolerated                          Hyponatremia- improved              voiding well               Bowel regimen   8.Ex-fix/Splint care:             Do not remove splint             Do not get splint wet             Ice and elevate   9. Impediments to fracture healing:             Nicotine dependence  10. Dispo:            D/c home today             Follow up in 14 days at office               Will write for wellbutrin SR as pt is interested in smoking cessation    Disposition: 06-Home-Health Care Svc      Discharge Orders   Future Orders Complete By Expires     Call MD / Call 911  As directed     Comments:      If you experience chest pain or shortness of breath, CALL 911 and be transported to the hospital emergency room.  If you develope a fever above 101 F, pus (white drainage) or increased drainage or redness at the wound, or calf pain, call your surgeon's office.    Constipation Prevention  As directed     Comments:      Drink plenty of fluids.  Prune juice may be helpful.  You may use a stool softener, such as Colace (over the counter) 100 mg twice a day.  Use MiraLax (over the counter) for constipation as needed.    Diet general  As directed     Discharge instructions  As directed     Comments:      Orthopaedic Trauma Service Discharge Instructions   General Discharge Instructions  WEIGHT BEARING STATUS: Nonweightbearing Left Leg  RANGE OF MOTION/ACTIVITY:activity as tolerated while maintaining weightbearing restrictions. Left Knee and Hip  range of motion as tolerated   Diet: as you were eating previously.  Can use over the counter stool softeners and bowel preparations, such as Miralax, to help with bowel movements.  Narcotics can be constipating.  Be sure to drink plenty of fluids  STOP SMOKING OR USING NICOTINE PRODUCTS!!!!  As discussed nicotine severely impairs your body's ability to heal surgical and traumatic wounds but also impairs bone  healing.  Wounds and bone heal by forming microscopic blood vessels (angiogenesis) and nicotine is a vasoconstrictor (essentially, shrinks blood vessels).  Therefore, if vasoconstriction occurs to these microscopic blood vessels they essentially disappear and are unable to deliver necessary nutrients to the healing tissue.  This is one modifiable factor that you can do to dramatically increase your chances of healing your injury.    (This means no smoking, no nicotine gum, patches, etc)  DO NOT USE NONSTEROIDAL ANTI-INFLAMMATORY DRUGS (NSAID'S)  Using products such as Advil (ibuprofen), Aleve (naproxen), Motrin (ibuprofen) for additional pain control during fracture healing can delay and/or prevent the healing response.  If you would like to take over the counter (OTC) medication, Tylenol (acetaminophen) is ok.  However, some narcotic medications that are given for pain control contain acetaminophen as well. Therefore, you should not exceed more than 4000 mg of tylenol in a day if you do not have liver disease.  Also note that there are may OTC medicines, such as cold medicines and allergy medicines that my contain tylenol as well.  If you have any questions about medications and/or interactions please ask your doctor/PA or your pharmacist.   PAIN MEDICATION USE AND EXPECTATIONS  You have likely been given narcotic medications to help control your pain.  After a traumatic event that results in an fracture (broken bone) with or without surgery, it is ok to use narcotic pain medications to help  control one's pain.  We understand that everyone responds to pain differently and each individual patient will be evaluated on a regular basis for the continued need for narcotic medications. Ideally, narcotic medication use should last no more than 6-8 weeks (coinciding with fracture healing).   As a patient it is your responsibility as well to monitor narcotic medication use and report the amount and frequency you use these medications when you come to your office visit.   We would also advise that if you are using narcotic medications, you should take a dose prior to therapy to maximize you participation.  IF YOU ARE ON NARCOTIC MEDICATIONS IT IS NOT PERMISSIBLE TO OPERATE A MOTOR VEHICLE (MOTORCYCLE/CAR/TRUCK/MOPED) OR HEAVY MACHINERY DO NOT MIX NARCOTICS WITH OTHER CNS (CENTRAL NERVOUS SYSTEM) DEPRESSANTS SUCH AS ALCOHOL       ICE AND ELEVATE INJURED/OPERATIVE EXTREMITY  Using ice and elevating the injured extremity above your heart can help with swelling and pain control.  Icing in a pulsatile fashion, such as 20 minutes on and 20 minutes off, can be followed.    Do not place ice directly on skin. Make sure there is a barrier between to skin and the ice pack.    Using frozen items such as frozen peas works well as the conform nicely to the are that needs to be iced.  USE AN ACE WRAP OR TED HOSE FOR SWELLING CONTROL  In addition to icing and elevation, Ace wraps or TED hose are used to help limit and resolve swelling.  It is recommended to use Ace wraps or TED hose until you are informed to stop.    When using Ace Wraps start the wrapping distally (farthest away from the body) and wrap proximally (closer to the body)   Example: If you had surgery on your leg or thing and you do not have a splint on, start the ace wrap at the toes and work your way up to the thigh        If you had surgery on your upper extremity and  do not have a splint on, start the ace wrap at your fingers and work your way  up to the upper arm  IF YOU ARE IN A SPLINT OR CAST DO NOT REMOVE IT FOR ANY REASON   If your splint gets wet for any reason please contact the office immediately. You may shower in your splint or cast as long as you keep it dry.  This can be done by wrapping in a cast cover or garbage back (or similar)  Do Not stick any thing down your splint or cast such as pencils, money, or hangers to try and scratch yourself with.  If you feel itchy take benadryl as prescribed on the bottle for itching  IF YOU ARE IN A CAM BOOT (BLACK BOOT)  You may remove boot periodically. Perform daily dressing changes as noted below.  Wash the liner of the boot regularly and wear a sock when wearing the boot. It is recommended that you sleep in the boot until told otherwise  CALL THE OFFICE WITH ANY QUESTIONS OR CONCERTS: [phone]    Driving restrictions  As directed     Comments:      No driving    Increase activity slowly as tolerated  As directed     Lifting restrictions  As directed     Comments:      No lifting    Non weight bearing  As directed         Medication List    STOP taking these medications       oxyCODONE-acetaminophen 5-325 MG per tablet  Commonly known as:  PERCOCET/ROXICET      TAKE these medications       buPROPion 150 MG 12 hr tablet  Commonly known as:  WELLBUTRIN SR  Take 1 tablet (150 mg total) by mouth 2 (two) times daily.     cyclobenzaprine 10 MG tablet  Commonly known as:  FLEXERIL  Take 1 tablet (10 mg total) by mouth 3 (three) times daily.     DSS 100 MG Caps  Take 100 mg by mouth 2 (two) times daily.     enoxaparin 40 MG/0.4ML injection  Commonly known as:  LOVENOX  Inject 0.4 mLs (40 mg total) into the skin daily.     oxyCODONE 5 MG immediate release tablet  Commonly known as:  Oxy IR/ROXICODONE  Take 1-2 tablets (5-10 mg total) by mouth every 3 (three) hours as needed (breakthrough pain).     oxyCODONE-acetaminophen 10-325 MG per tablet  Commonly known  as:  PERCOCET  Take 1-2 tablets by mouth every 6 (six) hours as needed for pain.     pregabalin 75 MG capsule  Commonly known as:  LYRICA  Take 1 capsule (75 mg total) by mouth 3 (three) times daily.       Follow-up Information   Follow up with HANDY,MICHAEL H, MD. Schedule an appointment as soon as possible for a visit in 2 weeks. (call for appointment )    Contact information:   [address] [place]lyn [place] [address] [phone]       Discharge Instructions and Plan:  Patient has sustained a severe injury to his left distal tibia. The with profound joint destruction with delamination and loss of articular cartilage. We were able to achieve excellent fixation and alignment with plate osteosynthesis. However there is again severe damage to the joint surfaces as well. Patient remains at elevated risk for symptomatic posttraumatic arthritis. However  only time will tell if patient will be, symptomatic. Mr. Zietz will be nonweightbearing for the next 8 weeks on his left leg. You remain in a splint for the next 2 weeks. After 2 weeks remove her splint and place him into a cam walker. He will be permitted to perform range of motion of his left ankle at that time. We will send him for outpatient physical therapy at that time as well.  Mr. Joung will remain on Lovenox for DVT and PE prophylaxis for the next 4 weeks. He did have a CT angiogram performed in the hospital. This was negative for acute pulmonary embolus.  The patient will continue on his pain regimen including Percocet, OxyIR, Lyrica and Flexeril.  He'll continue with a regular diet.  The patient did show every high interest in smoking cessation. He is agreeable to a Wellbutrin SR at this time. I will start with 150 mg by mouth daily x3 days and then 150 mg by mouth every 12 hours for the next 12 weeks. This will be of great benefit for the patient by optimizing his healing potential  We will  check the patient back in 2 weeks for removal of a splint removal of sutures and commencement of range of motion activities    Signed:  Mearl Latin, PA-C [facility] [phone] (P) 10/01/2012, 10:25 AM

## 2012-10-06 NOTE — Progress Notes (Signed)
We have reviewed findings in detail. CTA and cards consult initiated.  Myrene Galas, MD [facility] [phone] (p)

## 2013-03-10 ENCOUNTER — Other Ambulatory Visit: Payer: Self-pay

## 2014-06-26 ENCOUNTER — Emergency Department: Payer: Self-pay | Admitting: Emergency Medicine

## 2016-07-08 ENCOUNTER — Encounter: Payer: Self-pay | Admitting: Emergency Medicine

## 2016-07-08 ENCOUNTER — Observation Stay
Admission: EM | Admit: 2016-07-08 | Discharge: 2016-07-11 | Disposition: A | Discharge status: Home / Self Care | Payer: Self-pay | Attending: Internal Medicine | Admitting: Internal Medicine

## 2016-07-08 ENCOUNTER — Observation Stay (HOSPITAL_BASED_OUTPATIENT_CLINIC_OR_DEPARTMENT_OTHER): Payer: Self-pay

## 2016-07-08 ENCOUNTER — Emergency Department: Payer: Self-pay

## 2016-07-08 ENCOUNTER — Observation Stay (HOSPITAL_BASED_OUTPATIENT_CLINIC_OR_DEPARTMENT_OTHER)
Admission: EM | Admit: 2016-07-08 | Discharge: 2016-07-08 | Disposition: A | Discharge status: Home / Self Care | Payer: Self-pay | Source: Home / Self Care | Attending: Internal Medicine | Admitting: Internal Medicine

## 2016-07-08 DIAGNOSIS — R519 Headache, unspecified: Secondary | ICD-10-CM

## 2016-07-08 DIAGNOSIS — Z87891 Personal history of nicotine dependence: Secondary | ICD-10-CM | POA: Insufficient documentation

## 2016-07-08 DIAGNOSIS — K760 Fatty (change of) liver, not elsewhere classified: Secondary | ICD-10-CM | POA: Insufficient documentation

## 2016-07-08 DIAGNOSIS — R51 Headache: Secondary | ICD-10-CM

## 2016-07-08 DIAGNOSIS — D680 Von Willebrand's disease: Secondary | ICD-10-CM | POA: Insufficient documentation

## 2016-07-08 DIAGNOSIS — Z791 Long term (current) use of non-steroidal anti-inflammatories (NSAID): Secondary | ICD-10-CM | POA: Insufficient documentation

## 2016-07-08 DIAGNOSIS — R079 Chest pain, unspecified: Secondary | ICD-10-CM

## 2016-07-08 DIAGNOSIS — E860 Dehydration: Secondary | ICD-10-CM | POA: Insufficient documentation

## 2016-07-08 DIAGNOSIS — Z880 Allergy status to penicillin: Secondary | ICD-10-CM | POA: Insufficient documentation

## 2016-07-08 DIAGNOSIS — Z79899 Other long term (current) drug therapy: Secondary | ICD-10-CM | POA: Insufficient documentation

## 2016-07-08 DIAGNOSIS — Z885 Allergy status to narcotic agent status: Secondary | ICD-10-CM | POA: Insufficient documentation

## 2016-07-08 DIAGNOSIS — R0789 Other chest pain: Principal | ICD-10-CM | POA: Insufficient documentation

## 2016-07-08 DIAGNOSIS — D72829 Elevated white blood cell count, unspecified: Secondary | ICD-10-CM | POA: Insufficient documentation

## 2016-07-08 LAB — LIPASE, BLOOD: Lipase: 30 U/L (ref 11–51)

## 2016-07-08 LAB — URINE DRUG SCREEN, QUALITATIVE (ARMC ONLY)
AMPHETAMINES, UR SCREEN: NOT DETECTED
BENZODIAZEPINE, UR SCRN: NOT DETECTED
Barbiturates, Ur Screen: NOT DETECTED
Cannabinoid 50 Ng, Ur ~~LOC~~: NOT DETECTED
Cocaine Metabolite,Ur ~~LOC~~: NOT DETECTED
MDMA (ECSTASY) UR SCREEN: NOT DETECTED
Methadone Scn, Ur: NOT DETECTED
Opiate, Ur Screen: NOT DETECTED
Phencyclidine (PCP) Ur S: NOT DETECTED
TRICYCLIC, UR SCREEN: NOT DETECTED

## 2016-07-08 LAB — NM MYOCAR MULTI W/SPECT W/WALL MOTION / EF
CHL CUP RESTING HR STRESS: 81 {beats}/min
CSEPHR: 63 %
LV sys vol: 29 mL
LVDIAVOL: 70 mL (ref 62–150)
Peak HR: 117 {beats}/min
TID: 1.1

## 2016-07-08 LAB — BASIC METABOLIC PANEL
Anion gap: 13 (ref 5–15)
BUN: 23 mg/dL — ABNORMAL HIGH (ref 6–20)
CO2: 23 mmol/L (ref 22–32)
Calcium: 9.2 mg/dL (ref 8.9–10.3)
Chloride: 107 mmol/L (ref 101–111)
Creatinine, Ser: 1.07 mg/dL (ref 0.61–1.24)
GFR calc Af Amer: >60 mL/min (ref >60)
GLUCOSE: 130 mg/dL — AB (ref 65–99)
POTASSIUM: 3.8 mmol/L (ref 3.5–5.1)
SODIUM: 143 mmol/L (ref 135–145)

## 2016-07-08 LAB — CBC
HEMATOCRIT: 42.8 % (ref 40.0–52.0)
HEMOGLOBIN: 15 g/dL (ref 13.0–18.0)
MCH: 33 pg (ref 26.0–34.0)
MCHC: 35 g/dL (ref 32.0–36.0)
MCV: 94.3 fL (ref 80.0–100.0)
Platelets: 249 10*3/uL (ref 150–440)
RBC: 4.54 MIL/uL (ref 4.40–5.90)
RDW: 13.1 % (ref 11.5–14.5)
WBC: 17 10*3/uL — AB (ref 3.8–10.6)

## 2016-07-08 LAB — HEPATIC FUNCTION PANEL
ALT: 26 U/L (ref 17–63)
AST: 24 U/L (ref 15–41)
Albumin: 4.4 g/dL (ref 3.5–5.0)
Alkaline Phosphatase: 111 U/L (ref 38–126)
BILIRUBIN INDIRECT: 0.7 mg/dL (ref 0.3–0.9)
Bilirubin, Direct: 0.1 mg/dL (ref 0.1–0.5)
TOTAL PROTEIN: 7.2 g/dL (ref 6.5–8.1)
Total Bilirubin: 0.8 mg/dL (ref 0.3–1.2)

## 2016-07-08 LAB — ETHANOL

## 2016-07-08 LAB — TROPONIN I
Troponin I: <0.03 ng/mL (ref <0.03)
Troponin I: <0.03 ng/mL (ref <0.03)

## 2016-07-08 MED ORDER — IOPAMIDOL (ISOVUE-370) INJECTION 76%
100.0000 mL | Freq: Once | INTRAVENOUS | Status: AC | PRN
  Administered 2016-07-08: 100 mL via INTRAVENOUS

## 2016-07-08 MED ORDER — CYCLOBENZAPRINE HCL 10 MG PO TABS
10.0000 mg | ORAL_TABLET | Freq: Three times a day (TID) | ORAL | Status: DC
  Administered 2016-07-08 – 2016-07-09 (×3): 10 mg via ORAL
  Filled 2016-07-08 (×3): qty 1

## 2016-07-08 MED ORDER — SODIUM CHLORIDE 0.9 % IV SOLN
INTRAVENOUS | Status: DC
  Administered 2016-07-08 – 2016-07-10 (×5): via INTRAVENOUS

## 2016-07-08 MED ORDER — FENTANYL CITRATE (PF) 100 MCG/2ML IJ SOLN
50.0000 ug | Freq: Once | INTRAMUSCULAR | Status: AC
  Administered 2016-07-08: 50 ug via INTRAVENOUS
  Filled 2016-07-08: qty 2

## 2016-07-08 MED ORDER — SODIUM CHLORIDE 0.9 % IV BOLUS (SEPSIS)
1000.0000 mL | Freq: Once | INTRAVENOUS | Status: AC
  Administered 2016-07-08: 1000 mL via INTRAVENOUS

## 2016-07-08 MED ORDER — SODIUM CHLORIDE 0.9% FLUSH: 3.0000 mL | INTRAVENOUS | Status: DC | PRN

## 2016-07-08 MED ORDER — LORAZEPAM 2 MG/ML IJ SOLN
1.0000 mg | Freq: Once | INTRAMUSCULAR | Status: AC
  Administered 2016-07-08: 1 mg via INTRAVENOUS
  Filled 2016-07-08: qty 1

## 2016-07-08 MED ORDER — MECLIZINE HCL 25 MG PO TABS
25.0000 mg | ORAL_TABLET | Freq: Three times a day (TID) | ORAL | Status: DC
  Administered 2016-07-08 – 2016-07-09 (×3): 25 mg via ORAL
  Filled 2016-07-08 (×3): qty 1

## 2016-07-08 MED ORDER — NITROGLYCERIN 0.4 MG SL SUBL: 0.4000 mg | SUBLINGUAL_TABLET | SUBLINGUAL | Status: DC | PRN

## 2016-07-08 MED ORDER — ASPIRIN 300 MG RE SUPP: 300.0000 mg | RECTAL | Status: DC

## 2016-07-08 MED ORDER — DOCUSATE SODIUM 100 MG PO CAPS
100.0000 mg | ORAL_CAPSULE | Freq: Two times a day (BID) | ORAL | Status: DC
  Administered 2016-07-08 – 2016-07-09 (×2): 100 mg via ORAL
  Filled 2016-07-08 (×2): qty 1

## 2016-07-08 MED ORDER — ONDANSETRON HCL 4 MG/2ML IJ SOLN
4.0000 mg | Freq: Once | INTRAMUSCULAR | Status: AC
  Administered 2016-07-08: 4 mg via INTRAVENOUS
  Filled 2016-07-08: qty 2

## 2016-07-08 MED ORDER — ONDANSETRON HCL 4 MG/2ML IJ SOLN: 4.0000 mg | Freq: Four times a day (QID) | INTRAMUSCULAR | Status: DC | PRN

## 2016-07-08 MED ORDER — ACETAMINOPHEN 325 MG PO TABS
650.0000 mg | ORAL_TABLET | ORAL | Status: DC | PRN
  Administered 2016-07-10 – 2016-07-11 (×3): 650 mg via ORAL
  Filled 2016-07-08 (×3): qty 2

## 2016-07-08 MED ORDER — NITROGLYCERIN 0.4 MG SL SUBL
0.4000 mg | SUBLINGUAL_TABLET | SUBLINGUAL | Status: DC | PRN
  Administered 2016-07-08 (×3): 0.4 mg via SUBLINGUAL
  Filled 2016-07-08: qty 1

## 2016-07-08 MED ORDER — OXYCODONE-ACETAMINOPHEN 7.5-325 MG PO TABS
1.0000 | ORAL_TABLET | ORAL | Status: DC | PRN
  Administered 2016-07-08 – 2016-07-10 (×5): 1 via ORAL
  Filled 2016-07-08 (×5): qty 1

## 2016-07-08 MED ORDER — KETOROLAC TROMETHAMINE 30 MG/ML IJ SOLN
30.0000 mg | Freq: Once | INTRAMUSCULAR | Status: AC
  Administered 2016-07-08: 30 mg via INTRAVENOUS
  Filled 2016-07-08: qty 1

## 2016-07-08 MED ORDER — BUPROPION HCL ER (SR) 150 MG PO TB12
150.0000 mg | ORAL_TABLET | Freq: Two times a day (BID) | ORAL | Status: DC
  Administered 2016-07-08 – 2016-07-09 (×3): 150 mg via ORAL
  Filled 2016-07-08 (×5): qty 1

## 2016-07-08 MED ORDER — TECHNETIUM TC 99M TETROFOSMIN IV KIT
13.0000 | PACK | Freq: Once | INTRAVENOUS | Status: AC | PRN
  Administered 2016-07-08: 14.06 via INTRAVENOUS

## 2016-07-08 MED ORDER — ENOXAPARIN SODIUM 40 MG/0.4ML ~~LOC~~ SOLN
40.0000 mg | SUBCUTANEOUS | Status: DC
  Administered 2016-07-08 – 2016-07-11 (×4): 40 mg via SUBCUTANEOUS
  Filled 2016-07-08 (×4): qty 0.4

## 2016-07-08 MED ORDER — DEXAMETHASONE SODIUM PHOSPHATE 10 MG/ML IJ SOLN
10.0000 mg | Freq: Once | INTRAMUSCULAR | Status: AC
  Administered 2016-07-08: 10 mg via INTRAVENOUS
  Filled 2016-07-08: qty 1

## 2016-07-08 MED ORDER — MAGNESIUM SULFATE 2 GM/50ML IV SOLN
2.0000 g | Freq: Once | INTRAVENOUS | Status: AC
  Administered 2016-07-08: 2 g via INTRAVENOUS
  Filled 2016-07-08: qty 50

## 2016-07-08 MED ORDER — LORAZEPAM 2 MG/ML IJ SOLN: 1.0000 mg | Freq: Once | INTRAMUSCULAR | Status: DC

## 2016-07-08 MED ORDER — TECHNETIUM TC 99M TETROFOSMIN IV KIT
30.0000 | PACK | Freq: Once | INTRAVENOUS | Status: AC | PRN
  Administered 2016-07-08: 32.48 via INTRAVENOUS

## 2016-07-08 MED ORDER — ASPIRIN 81 MG PO CHEW
324.0000 mg | CHEWABLE_TABLET | Freq: Once | ORAL | Status: AC
  Administered 2016-07-08: 324 mg via ORAL
  Filled 2016-07-08: qty 4

## 2016-07-08 MED ORDER — PREGABALIN 75 MG PO CAPS
75.0000 mg | ORAL_CAPSULE | Freq: Three times a day (TID) | ORAL | Status: DC
  Administered 2016-07-08 – 2016-07-09 (×3): 75 mg via ORAL
  Filled 2016-07-08 (×3): qty 1

## 2016-07-08 MED ORDER — ASPIRIN EC 81 MG PO TBEC
81.0000 mg | DELAYED_RELEASE_TABLET | Freq: Every day | ORAL | Status: DC
  Administered 2016-07-09: 81 mg via ORAL
  Filled 2016-07-08: qty 1

## 2016-07-08 MED ORDER — SODIUM CHLORIDE 0.9 % IV SOLN: 250.0000 mL | INTRAVENOUS | Status: DC | PRN

## 2016-07-08 MED ORDER — ASPIRIN 81 MG PO CHEW
324.0000 mg | CHEWABLE_TABLET | ORAL | Status: DC
  Filled 2016-07-08: qty 4

## 2016-07-08 MED ORDER — REGADENOSON 0.4 MG/5ML IV SOLN
0.4000 mg | Freq: Once | INTRAVENOUS | Status: AC
  Administered 2016-07-08: 0.4 mg via INTRAVENOUS

## 2016-07-08 MED ORDER — SODIUM CHLORIDE 0.9% FLUSH
3.0000 mL | Freq: Two times a day (BID) | INTRAVENOUS | Status: DC
  Administered 2016-07-08 – 2016-07-11 (×3): 3 mL via INTRAVENOUS

## 2016-07-08 NOTE — ED Notes (Signed)
Patient drank mountain dew 1.5 hours ago. Stress test will be postponed until tomorrow. Patient advised to not have any caffeine.

## 2016-07-08 NOTE — H&P (Signed)
[facility]   PATIENT NAME: Ryan Santiago    MR#:  [id number]  DATE OF BIRTH:  24-Sep-1980  DATE OF ADMISSION:  07/08/2016  PRIMARY CARE PHYSICIAN: No PCP Per Patient   REQUESTING/REFERRING PHYSICIAN:   CHIEF COMPLAINT:   Chief Complaint  Patient presents with  . Chest Pain    HISTORY OF PRESENT ILLNESS: Ryan Santiago  is a 36 y.o. male with a known history of Tobacco abuse, one Willebrand disease presented to the emergency room with chest pain. The chest pain started around 10 PM last night was left-sided. Patient noticed the chest pain after he had sex last night. The pain is sharp in nature 10 out of 10 on a scale of 1-10 located in the left side of the chest. The pain radiated to the left arm and left side of the neck. No complaints of any shortness of breath, orthopnea and palpitations. No fever or chills and cough. First set of troponin is negative. EKG showed sinus tachycardia. Patient was worked up with CT angiogram of the chest which showed no pulmonary embolism. Hospitalist service was consulted for further care of the patient.  PAST MEDICAL HISTORY:   Past Medical History:  Diagnosis Date  . Closed left ankle fracture    a. 08/2012: Fall from ladder->Left pilon dislocation and poss compartment syndrome->CRIF, closed manipulation/reduction of pilon/tib/fib, ext fixator, fasciotomies;  b. 09/2012 ORIF dist tib/fib.  . Sinus tachycardia   . Tobacco abuse   . Von Willebrand disease (HCC)     PAST SURGICAL HISTORY: Past Surgical History:  Procedure Laterality Date  . APPLICATION OF WOUND VAC Left 08/28/2012   Procedure: APPLICATION OF WOUND VAC;  Surgeon: Budd Palmer, MD;  Location: [facility] OR;  Service: Orthopedics;  Laterality: Left;; (out on 08/31/2012) (09/28/2012)  . CYSTIC HYGROMA EXCISION  2001   small intestinal removal; "benign" (09/28/2012)  . DORSAL COMPARTMENT RELEASE Left 08/28/2012   Procedure: RELEASE DORSAL COMPARTMENT  (DEQUERVAIN);  Surgeon: Budd Palmer, MD;  Location: [facility] OR;  Service: Orthopedics;  Laterality: Left;  . EXTERNAL FIXATION LEG Left 08/28/2012   Procedure: EXTERNAL FIXATION Left Tibia fuibula fracture;  Surgeon: Budd Palmer, MD;  Location: [facility] OR;  Service: Orthopedics;  Laterality: Left;  . FASCIOTOMY Left 08/30/2012   Procedure: FASCIOTOMY CLOSURE LEFT LEG;  Surgeon: Budd Palmer, MD;  Location: [facility] OR;  Service: Orthopedics;  Laterality: Left;  . KNEE ARTHROSCOPY Right 04/2009  . OPEN REDUCTION INTERNAL FIXATION (ORIF) TIBIA/FIBULA FRACTURE Left 09/28/2012   REMOVING EXTERNAL FIXATOR   . ORIF ANKLE FRACTURE Left 09/28/2012   Procedure: OPEN REDUCTION INTERNAL FIXATION (ORIF) DISTAL TIBIA FRACTURE, REMOVING EXTERNAL FIXATOR;  Surgeon: Budd Palmer, MD;  Location: [facility] OR;  Service: Orthopedics;  Laterality: Left;    SOCIAL HISTORY:  Social History  Substance Use Topics  . Smoking status: Former Smoker    Packs/day: 1.00    Years: 16.00    Types: E-cigarettes  . Smokeless tobacco: Never Used  . Alcohol use No    FAMILY HISTORY:  Family History  Problem Relation Age of Onset  . Ovarian cancer Mother     alive - early 80's  . Other Father     alive and well.    DRUG ALLERGIES:  Allergies  Allergen Reactions  . Morphine And Related Anaphylaxis    Arm became swollen, ultimately intubated  . Dilaudid [Hydromorphone Hcl]     Hallucinations and disorientation  . Penicillins Hives  REVIEW OF SYSTEMS:   CONSTITUTIONAL: No fever, fatigue or weakness.  EYES: No blurred or double vision.  EARS, NOSE, AND THROAT: No tinnitus or ear pain.  RESPIRATORY: No cough, shortness of breath, wheezing or hemoptysis.  CARDIOVASCULAR: Has chest pain, no orthopnea, edema.  GASTROINTESTINAL: No nausea, vomiting, diarrhea or abdominal pain.  GENITOURINARY: No dysuria, hematuria.  ENDOCRINE: No polyuria, nocturia,  HEMATOLOGY: No anemia, easy bruising or bleeding SKIN: No rash or  lesion. MUSCULOSKELETAL: No joint pain or arthritis.   NEUROLOGIC: No tingling, numbness, weakness.  PSYCHIATRY: No anxiety or depression.   MEDICATIONS AT HOME:  Prior to Admission medications   Medication Sig Start Date End Date Taking? Authorizing Provider  buPROPion (WELLBUTRIN SR) 150 MG 12 hr tablet Take 1 tablet (150 mg total) by mouth 2 (two) times daily. 10/01/12   Montez MoritaKeith Paul, PA-C  cyclobenzaprine (FLEXERIL) 10 MG tablet Take 1 tablet (10 mg total) by mouth 3 (three) times daily. 10/01/12   Montez MoritaKeith Paul, PA-C  docusate sodium 100 MG CAPS Take 100 mg by mouth 2 (two) times daily. 10/01/12   Montez MoritaKeith Paul, PA-C  enoxaparin (LOVENOX) 40 MG/0.4ML injection Inject 0.4 mLs (40 mg total) into the skin daily. 10/01/12   Montez MoritaKeith Paul, PA-C  oxyCODONE (OXY IR/ROXICODONE) 5 MG immediate release tablet Take 1-2 tablets (5-10 mg total) by mouth every 3 (three) hours as needed (breakthrough pain). 10/01/12   Montez MoritaKeith Paul, PA-C  oxyCODONE-acetaminophen (PERCOCET) 10-325 MG per tablet Take 1-2 tablets by mouth every 6 (six) hours as needed for pain. 10/01/12   Montez MoritaKeith Paul, PA-C  pregabalin (LYRICA) 75 MG capsule Take 1 capsule (75 mg total) by mouth 3 (three) times daily. 10/01/12   Montez MoritaKeith Paul, PA-C      PHYSICAL EXAMINATION:   VITAL SIGNS: Blood pressure (!) 142/88, pulse 92, temperature 98 F (36.7 C), temperature source Oral, resp. rate 18, height 6' (1.829 m), weight 101.2 kg (223 lb), SpO2 99 %.  GENERAL:  36 y.o.-year-old patient lying in the bed with no acute distress.  EYES: Pupils equal, round, reactive to light and accommodation. No scleral icterus. Extraocular muscles intact.  HEENT: Head atraumatic, normocephalic. Oropharynx and nasopharynx clear.  NECK:  Supple, no jugular venous distention. No thyroid enlargement, no tenderness.  LUNGS: Normal breath sounds bilaterally, no wheezing, rales,rhonchi or crepitation. No use of accessory muscles of respiration.  CARDIOVASCULAR: S1, S2 normal. No  murmurs, rubs, or gallops.  ABDOMEN: Soft, nontender, nondistended. Bowel sounds present. No organomegaly or mass.  EXTREMITIES: No pedal edema, cyanosis, or clubbing.  NEUROLOGIC: Cranial nerves II through XII are intact. Muscle strength 5/5 in all extremities. Sensation intact. Gait not checked.  PSYCHIATRIC: The patient is alert and oriented x 3.  SKIN: No obvious rash, lesion, or ulcer.   LABORATORY PANEL:   CBC  Recent Labs Lab 07/08/16 0046  WBC 17.0*  HGB 15.0  HCT 42.8  PLT 249  MCV 94.3  MCH 33.0  MCHC 35.0  RDW 13.1   ------------------------------------------------------------------------------------------------------------------  Chemistries   Recent Labs Lab 07/08/16 0046  NA 143  K 3.8  CL 107  CO2 23  GLUCOSE 130*  BUN 23*  CREATININE 1.07  CALCIUM 9.2   ------------------------------------------------------------------------------------------------------------------ estimated creatinine clearance is 116.3 mL/min (by C-G formula based on SCr of 1.07 mg/dL). ------------------------------------------------------------------------------------------------------------------ No results for input(s): TSH, T4TOTAL, T3FREE, THYROIDAB in the last 72 hours.  Invalid input(s): FREET3   Coagulation profile No results for input(s): INR, PROTIME in the last 168 hours. ------------------------------------------------------------------------------------------------------------------- No results  for input(s): DDIMER in the last 72 hours. -------------------------------------------------------------------------------------------------------------------  Cardiac Enzymes  Recent Labs Lab 07/08/16 0046 07/08/16 0413  TROPONINI <0.03 <0.03   ------------------------------------------------------------------------------------------------------------------ Invalid input(s):  POCBNP  ---------------------------------------------------------------------------------------------------------------  Urinalysis    Component Value Date/Time   COLORURINE YELLOW 08/31/2012 0200   APPEARANCEUR CLEAR 08/31/2012 0200   LABSPEC 1.009 08/31/2012 0200   PHURINE 6.0 08/31/2012 0200   GLUCOSEU NEGATIVE 08/31/2012 0200   HGBUR NEGATIVE 08/31/2012 0200   BILIRUBINUR NEGATIVE 08/31/2012 0200   KETONESUR NEGATIVE 08/31/2012 0200   PROTEINUR NEGATIVE 08/31/2012 0200   UROBILINOGEN 0.2 08/31/2012 0200   NITRITE NEGATIVE 08/31/2012 0200   LEUKOCYTESUR NEGATIVE 08/31/2012 0200     RADIOLOGY: Dg Chest 2 View  Result Date: 07/08/2016 CLINICAL DATA:  Sudden onset left-sided chest pain with nausea. Dyspnea. EXAM: CHEST  2 VIEW COMPARISON:  08/28/2012 FINDINGS: Shallow inspiration. The lungs are clear. Pulmonary vasculature is normal. Hilar, mediastinal and cardiac contours are unremarkable and unchanged. IMPRESSION: No active cardiopulmonary disease. Electronically Signed   By: Ellery Plunk M.D.   On: 07/08/2016 01:12   Ct Angio Chest Aorta W And/or Wo Contrast  Result Date: 07/08/2016 CLINICAL DATA:  Sudden onset left-sided chest pain with nausea and dyspnea. The pain radiates into his left neck and left upper extremity. EXAM: CT ANGIOGRAPHY CHEST WITH CONTRAST TECHNIQUE: Multidetector CT imaging of the chest was performed using the standard protocol during bolus administration of intravenous contrast. Multiplanar CT image reconstructions and MIPs were obtained to evaluate the vascular anatomy. CONTRAST:  100 mL Isovue 370 intravenous COMPARISON:  Radiographs 07/08/2016, CT 09/30/2012 FINDINGS: Cardiovascular: The thoracic aorta is normal in caliber and intact. Pulmonary vasculature is unremarkable, with no evidence of pulmonary embolism. Heart is normal in size and unremarkable. No significant atherosclerotic changes. Mediastinum/Nodes: No enlarged mediastinal, hilar, or axillary  lymph nodes. Thyroid gland, trachea, and esophagus demonstrate no significant findings. Lungs/Pleura: Minimal linear scarring in the bases. No airspace consolidation. Airways are patent and normal in caliber. No pleural effusions. Upper Abdomen: Mild fatty infiltration of the liver, incompletely imaged. Musculoskeletal: No significant skeletal lesion. Review of the MIP images confirms the above findings. IMPRESSION: 1. No acute findings in the chest. 2. Hepatic steatosis. Electronically Signed   By: Ellery Plunk M.D.   On: 07/08/2016 04:41    EKG: Orders placed or performed during the hospital encounter of 07/08/16  . EKG 12-Lead  . EKG 12-Lead  . ED EKG within 10 minutes  . ED EKG within 10 minutes    IMPRESSION AND PLAN: 36 year old male patient with history of von Willebrand disease presented to the emergency room with chest pain after having sexual intercourse. Admitting diagnosis 1. Chest pain 2. Leukocytosis 3. Dehydration 4. Von Willebrand disease Treatment plan Admit patient to telemetry observation bed Aspirin 81 mg daily When necessary nitrates for chest pain Cycle troponin to rule out ischemia Check echocardiogram Cardiology consultation IV fluid hydration Follow-up WBC count.  All the records are reviewed and case discussed with ED provider. Management plans discussed with the patient, family and they are in agreement.  CODE STATUS:FULL CODE Code Status History    Date Active Date Inactive Code Status Order ID Comments User Context   09/28/2012  4:23 PM 10/01/2012  2:29 PM Full Code [id number]  Mearl Latin, PA-C Inpatient       TOTAL TIME TAKING CARE OF THIS PATIENT: 50 minutes.    Ihor Austin M.D on 07/08/2016 at 6:44 AM  Between 7am to 6pm - Pager -  [phone]  After 6pm go to [email or web address] - password [email or web address] [facility]  [place] [facility]  Office  [phone]  CC: Primary care physician; No PCP Per Patient

## 2016-07-08 NOTE — Care Management Note (Signed)
Case Management Note  Patient Details  Name: AZEKIEL CREMER MRN: [id number] Date of Birth: 07-12-1980  Subjective/Objective:                  Met with patient to discuss discharge planning. He does not have a PCP and he does not have health insurance. He lives with his wife. He states he has transportation. His contact is [phone]. He is independent with daily activities. He states he has trouble paying for medication and MD visits. He agrees to referral to [facility].  Action/Plan: Applications delivered and explained to patient for [facility] and [facility]; referral sent via email to [place] at both clinics. RNCM will continue to follow.   Expected Discharge Date:                  Expected Discharge Plan:     In-House Referral:     Discharge planning Services  CM Consult, Medication Assistance, [facility]  Post Acute Care Choice:    Choice offered to:  Patient  DME Arranged:    DME Agency:     HH Arranged:    [place] Agency:     Status of Service:  In process, will continue to follow  If discussed at Long Length of Stay Meetings, dates discussed:    Additional Comments:  Marshell Garfinkel, RN 07/08/2016, 8:56 AM

## 2016-07-08 NOTE — Progress Notes (Signed)
Patient ID: Ryan Santiago, male   DOB: 1981-03-03, 36 y.o.   MRN: [id number]  [facility] PROGRESS NOTE  Ryan Santiago WGN:[id number] DOB: 1981-03-03 DOA: 07/08/2016 PCP: No PCP Per Patient  HPI/Subjective: Patient got home from work last night, he ate dinner, watch TV and relations with his wife. 30-40 minutes later he developed nausea and sharp pain in his left shoulder. Next thing he remembers he woke up in and ambulance. Everything felt fuzzy in the room was spinning a little bit. He feels like there is a weight sitting on his chest. Last night he vomited in the ER. Now he has some horrible headache. Still feeling dizzy.  Objective: Vitals:   07/08/16 1454 07/08/16 1456  BP:  123/71  Pulse:  90  Resp:    Temp: 98.6 F (37 C)     Filed Weights   07/08/16 0040 07/08/16 0752  Weight: 101.2 kg (223 lb) 102.4 kg (225 lb 11.2 oz)    ROS: Review of Systems  Constitutional: Negative for chills and fever.  Eyes: Positive for blurred vision.  Respiratory: Positive for shortness of breath. Negative for cough.   Cardiovascular: Positive for chest pain and palpitations.  Gastrointestinal: Positive for nausea and vomiting. Negative for abdominal pain, constipation and diarrhea.  Genitourinary: Negative for dysuria.  Musculoskeletal: Negative for joint pain.  Neurological: Positive for dizziness and headaches.   Exam: Physical Exam  Constitutional: He is oriented to person, place, and time.  HENT:  Nose: No mucosal edema.  Mouth/Throat: No oropharyngeal exudate or posterior oropharyngeal edema.  Eyes: Conjunctivae, EOM and lids are normal. Pupils are equal, round, and reactive to light.  Patient got dizzy with IV movements  Neck: No JVD present. Carotid bruit is not present. No edema present. No thyroid mass and no thyromegaly present.  Cardiovascular: S1 normal and S2 normal.  Exam reveals no gallop.   No murmur heard. Pulses:      Dorsalis pedis pulses are 2+ on the  right side, and 2+ on the left side.  Respiratory: No respiratory distress. He has no wheezes. He has no rhonchi. He has no rales.  GI: Soft. Bowel sounds are normal. There is no tenderness.  Musculoskeletal:       Right ankle: He exhibits no swelling.       Left ankle: He exhibits no swelling.  Lymphadenopathy:    He has no cervical adenopathy.  Neurological: He is alert and oriented to person, place, and time. No cranial nerve deficit.  Patient got dizzy with IV movements back and forth.  Skin: Skin is warm. No rash noted. Nails show no clubbing.  Psychiatric: He has a normal mood and affect.      Data Reviewed: Basic Metabolic Panel:  Recent Labs Lab 07/08/16 0046  NA 143  K 3.8  CL 107  CO2 23  GLUCOSE 130*  BUN 23*  CREATININE 1.07  CALCIUM 9.2   Liver Function Tests:  Recent Labs Lab 07/08/16 0413  AST 24  ALT 26  ALKPHOS 111  BILITOT 0.8  PROT 7.2  ALBUMIN 4.4    Recent Labs Lab 07/08/16 0413  LIPASE 30   CBC:  Recent Labs Lab 07/08/16 0046  WBC 17.0*  HGB 15.0  HCT 42.8  MCV 94.3  PLT 249   Cardiac Enzymes:  Recent Labs Lab 07/08/16 0046 07/08/16 0413 07/08/16 1022  TROPONINI <0.03 <0.03 <0.03     Studies: Dg Chest 2 View  Result Date: 07/08/2016 CLINICAL  DATA:  Sudden onset left-sided chest pain with nausea. Dyspnea. EXAM: CHEST  2 VIEW COMPARISON:  08/28/2012 FINDINGS: Shallow inspiration. The lungs are clear. Pulmonary vasculature is normal. Hilar, mediastinal and cardiac contours are unremarkable and unchanged. IMPRESSION: No active cardiopulmonary disease. Electronically Signed   By: Ellery Plunk M.D.   On: 07/08/2016 01:12   Ct Angio Chest Aorta W And/or Wo Contrast  Result Date: 07/08/2016 CLINICAL DATA:  Sudden onset left-sided chest pain with nausea and dyspnea. The pain radiates into his left neck and left upper extremity. EXAM: CT ANGIOGRAPHY CHEST WITH CONTRAST TECHNIQUE: Multidetector CT imaging of the chest was  performed using the standard protocol during bolus administration of intravenous contrast. Multiplanar CT image reconstructions and MIPs were obtained to evaluate the vascular anatomy. CONTRAST:  100 mL Isovue 370 intravenous COMPARISON:  Radiographs 07/08/2016, CT 09/30/2012 FINDINGS: Cardiovascular: The thoracic aorta is normal in caliber and intact. Pulmonary vasculature is unremarkable, with no evidence of pulmonary embolism. Heart is normal in size and unremarkable. No significant atherosclerotic changes. Mediastinum/Nodes: No enlarged mediastinal, hilar, or axillary lymph nodes. Thyroid gland, trachea, and esophagus demonstrate no significant findings. Lungs/Pleura: Minimal linear scarring in the bases. No airspace consolidation. Airways are patent and normal in caliber. No pleural effusions. Upper Abdomen: Mild fatty infiltration of the liver, incompletely imaged. Musculoskeletal: No significant skeletal lesion. Review of the MIP images confirms the above findings. IMPRESSION: 1. No acute findings in the chest. 2. Hepatic steatosis. Electronically Signed   By: Ellery Plunk M.D.   On: 07/08/2016 04:41    Scheduled Meds: . aspirin  324 mg Oral NOW   Or  . aspirin  300 mg Rectal NOW  . [START ON 07/09/2016] aspirin EC  81 mg Oral Daily  . buPROPion  150 mg Oral BID  . cyclobenzaprine  10 mg Oral TID  . docusate sodium  100 mg Oral BID  . enoxaparin (LOVENOX) injection  40 mg Subcutaneous Q24H  . meclizine  25 mg Oral TID  . pregabalin  75 mg Oral TID  . sodium chloride flush  3 mL Intravenous Q12H   Continuous Infusions: . sodium chloride 100 mL/hr at 07/08/16 0951    Assessment/Plan:  1. Chest pain. CT scan of the chest negative for pulmonary embolism. Stress test results still pending but cardiac enzymes negative. 2. Headache and dizziness. Could be a viral labyrinthitis. Give IV Toradol, IV magnesium, IV Decadron and Antivert around the clock. Physical therapy evaluation. 3. History  of von Willebrand's disease 4. Leukocytosis probably secondary to vomiting recheck tomorrow morning after IV fluid hydration  Code Status:     Code Status Orders        Start     Ordered   07/08/16 0810  Full code  Continuous     07/08/16 0809    Code Status History    Date Active Date Inactive Code Status Order ID Comments User Context   09/28/2012  4:23 PM 10/01/2012  2:29 PM Full Code [id number]  Mearl Latin, PA-C Inpatient     Family Communication: Wife at the bedside Disposition Plan: TBD  Consultants:  Cardiology  Time spent: 38 minutes  Alford Highland  [facility]

## 2016-07-08 NOTE — Progress Notes (Signed)
   Patient to undergo treadmill stress test today. Has ruled out. If normal, can follow up as an outpatient with cardiology. If abnormal, full consult to follow.

## 2016-07-08 NOTE — Progress Notes (Signed)
Patient ID: Nona DellGeorge C Hollister Santiago, male   DOB: Aug 08, 1980, [age] y.o.   MRN: [id number] Called by nuclear medicine, patient drinking mountain dew, unable to do stress test. Dr Renae GlossWieting

## 2016-07-08 NOTE — ED Triage Notes (Addendum)
Pt presents to ED from home by EMS after he had a sudden onset of left sided chest pain with nausea and sob. Pt reports he and his wife had just finished having intercourse at the onset of his pain. Pt reportedly had no pain upon arrival to ED but states he does currently have 8/10 Chest pain that radiates to his left shoulder/armpit and into his neck. Pt states he feels like he had liquor tonight but denies ETOH or drug use.  Pt alert and clam at this time. No increased work of breathing or dsitress noted.

## 2016-07-08 NOTE — ED Provider Notes (Signed)
[facility] Emergency Department Provider Note  ____________________________________________   First MD Initiated Contact with Patient 07/08/16 0205     (approximate)  I have reviewed the triage vital signs and the nursing notes.   HISTORY  Chief Complaint Chest Pain   HPI Ryan Santiago is a 36 y.o. male with a history of von Willebrand disease who is presenting to the emergency department today with chest pain. He says that the chest pain started after sexual intercourse and feels like someone is stabbing in the left side of his chest. He said that initially that the pain felt like a pressure. His wife is very concerned with him because she says that he has also not acting like himself. The patient says that pain has been constant over the past several hours. Does not report any shortness of breath. No cardiac history. Denies any drinking or drug use.   Past Medical History:  Diagnosis Date  . Closed left ankle fracture    a. 08/2012: Fall from ladder->Left pilon dislocation and poss compartment syndrome->CRIF, closed manipulation/reduction of pilon/tib/fib, ext fixator, fasciotomies;  b. 09/2012 ORIF dist tib/fib.  . Sinus tachycardia   . Tobacco abuse   . Von Willebrand disease [facility])     Patient Active Problem List   Diagnosis Date Noted  . Atelectasis 10/01/2012  . Sinus tachycardia 09/30/2012  . Compartment syndrome of lower extremity, traumatic left leg 08/31/2012  . Fracture of metatarsal bone of left foot 08/31/2012  . Von Willebrand disease (HCC)   . Closed pilon fracture of left tibia 08/29/2012  . Fall 08/29/2012  . Nicotine dependence 08/29/2012    Past Surgical History:  Procedure Laterality Date  . APPLICATION OF WOUND VAC Left 08/28/2012   Procedure: APPLICATION OF WOUND VAC;  Surgeon: Budd Palmer, MD;  Location: [facility] OR;  Service: Orthopedics;  Laterality: Left;; (out on 08/31/2012) (09/28/2012)  . CYSTIC HYGROMA EXCISION   2001   small intestinal removal; "benign" (09/28/2012)  . DORSAL COMPARTMENT RELEASE Left 08/28/2012   Procedure: RELEASE DORSAL COMPARTMENT (DEQUERVAIN);  Surgeon: Budd Palmer, MD;  Location: [facility] OR;  Service: Orthopedics;  Laterality: Left;  . EXTERNAL FIXATION LEG Left 08/28/2012   Procedure: EXTERNAL FIXATION Left Tibia fuibula fracture;  Surgeon: Budd Palmer, MD;  Location: [facility] OR;  Service: Orthopedics;  Laterality: Left;  . FASCIOTOMY Left 08/30/2012   Procedure: FASCIOTOMY CLOSURE LEFT LEG;  Surgeon: Budd Palmer, MD;  Location: [facility] OR;  Service: Orthopedics;  Laterality: Left;  . KNEE ARTHROSCOPY Right 04/2009  . OPEN REDUCTION INTERNAL FIXATION (ORIF) TIBIA/FIBULA FRACTURE Left 09/28/2012   REMOVING EXTERNAL FIXATOR   . ORIF ANKLE FRACTURE Left 09/28/2012   Procedure: OPEN REDUCTION INTERNAL FIXATION (ORIF) DISTAL TIBIA FRACTURE, REMOVING EXTERNAL FIXATOR;  Surgeon: Budd Palmer, MD;  Location: [facility] OR;  Service: Orthopedics;  Laterality: Left;    Prior to Admission medications   Medication Sig Start Date End Date Taking? Authorizing Provider  buPROPion (WELLBUTRIN SR) 150 MG 12 hr tablet Take 1 tablet (150 mg total) by mouth 2 (two) times daily. 10/01/12   Montez Morita, PA-C  cyclobenzaprine (FLEXERIL) 10 MG tablet Take 1 tablet (10 mg total) by mouth 3 (three) times daily. 10/01/12   Montez Morita, PA-C  docusate sodium 100 MG CAPS Take 100 mg by mouth 2 (two) times daily. 10/01/12   Montez Morita, PA-C  enoxaparin (LOVENOX) 40 MG/0.4ML injection Inject 0.4 mLs (40 mg total) into the skin daily. 10/01/12  Montez MoritaKeith Paul, PA-C  oxyCODONE (OXY IR/ROXICODONE) 5 MG immediate release tablet Take 1-2 tablets (5-10 mg total) by mouth every 3 (three) hours as needed (breakthrough pain). 10/01/12   Montez MoritaKeith Paul, PA-C  oxyCODONE-acetaminophen (PERCOCET) 10-325 MG per tablet Take 1-2 tablets by mouth every 6 (six) hours as needed for pain. 10/01/12   Montez MoritaKeith Paul, PA-C  pregabalin (LYRICA) 75 MG capsule Take 1  capsule (75 mg total) by mouth 3 (three) times daily. 10/01/12   Montez MoritaKeith Paul, PA-C    Allergies Morphine and related; Dilaudid [hydromorphone hcl]; and Penicillins  Family History  Problem Relation Age of Onset  . Ovarian cancer Mother     alive - early 7250's  . Other Father     alive and well.    Social History Social History  Substance Use Topics  . Smoking status: Former Smoker    Packs/day: 1.00    Years: 16.00    Types: E-cigarettes  . Smokeless tobacco: Never Used  . Alcohol use No    Review of Systems Constitutional: No fever/chills Eyes: No visual changes. ENT: No sore throat. Cardiovascular: as above Respiratory: Denies shortness of breath. Gastrointestinal: No abdominal pain.  No nausea, no vomiting.  No diarrhea.  No constipation. Genitourinary: Negative for dysuria. Musculoskeletal: Negative for back pain. Skin: Negative for rash. Neurological: Negative for headaches, focal weakness or numbness.  10-point ROS otherwise negative.  ____________________________________________   PHYSICAL EXAM:  VITAL SIGNS: ED Triage Vitals  Enc Vitals Group     BP 07/08/16 0040 (!) 143/96     Pulse Rate 07/08/16 0040 (!) 106     Resp 07/08/16 0040 20     Temp 07/08/16 0040 98 F (36.7 C)     Temp Source 07/08/16 0040 Oral     SpO2 07/08/16 0040 95 %     Weight 07/08/16 0040 223 lb (101.2 kg)     Height 07/08/16 0040 6' (1.829 m)     Head Circumference --      Peak Flow --      Pain Score 07/08/16 0041 8     Pain Loc --      Pain Edu? --      Excl. in GC? --     Constitutional: Alert and oriented. Well appearing and in no acute distress. Eyes: Conjunctivae are normal. PERRL. EOMI. Head: Atraumatic. Nose: No congestion/rhinnorhea. Mouth/Throat: Mucous membranes are moist.   Neck: No stridor.   Cardiovascular: Normal rate, regular rhythm. Grossly normal heart sounds.  Equal bilateral radial pulses. Respiratory: Normal respiratory effort.  No retractions.  Lungs CTAB. Gastrointestinal: Soft and nontender. No distention.  Musculoskeletal: No lower extremity tenderness nor edema.  No joint effusions. Neurologic:  Normal speech and language. No gross focal neurologic deficits are appreciated.  Skin:  Skin is warm, dry and intact. No rash noted. Psychiatric: Mood and affect are normal. Speech and behavior are normal.  ____________________________________________   LABS (all labs ordered are listed, but only abnormal results are displayed)  Labs Reviewed  BASIC METABOLIC PANEL - Abnormal; Notable for the following:       Result Value   Glucose, Bld 130 (*)    BUN 23 (*)    All other components within normal limits  CBC - Abnormal; Notable for the following:    WBC 17.0 (*)    All other components within normal limits  TROPONIN I  TROPONIN I  URINE DRUG SCREEN, QUALITATIVE ([facility] ONLY)  ETHANOL  TROPONIN I  HEPATIC FUNCTION PANEL  LIPASE, BLOOD   ____________________________________________  EKG  ED ECG REPORT I, Arelia Longest, the attending physician, personally viewed and interpreted this ECG.   Date: 07/08/2016  EKG Time: 0044  Rate: 107  Rhythm: sinus tachycardia  Axis: Normal  Intervals:none  ST&T Change: No ST segment elevation or depression. No abnormal T-wave inversion.  ____________________________________________  RADIOLOGY    DG Chest 2 View (Final result)  Result time 07/08/16 01:12:43  Final result by Charlott Holler, MD (07/08/16 01:12:43)           Narrative:   CLINICAL DATA: Sudden onset left-sided chest pain with nausea. Dyspnea.  EXAM: CHEST 2 VIEW  COMPARISON: 08/28/2012  FINDINGS: Shallow inspiration. The lungs are clear. Pulmonary vasculature is normal. Hilar, mediastinal and cardiac contours are unremarkable and unchanged.  IMPRESSION: No active cardiopulmonary disease.   Electronically Signed By: Ellery Plunk M.D. On: 07/08/2016 01:12         CT Angio  Chest Aorta W and/or Wo Contrast (Final result)  Result time 07/08/16 04:41:31  Final result by Charlott Holler, MD (07/08/16 04:41:31)           Narrative:   CLINICAL DATA: Sudden onset left-sided chest pain with nausea and dyspnea. The pain radiates into his left neck and left upper extremity.  EXAM: CT ANGIOGRAPHY CHEST WITH CONTRAST  TECHNIQUE: Multidetector CT imaging of the chest was performed using the standard protocol during bolus administration of intravenous contrast. Multiplanar CT image reconstructions and MIPs were obtained to evaluate the vascular anatomy.  CONTRAST: 100 mL Isovue 370 intravenous  COMPARISON: Radiographs 07/08/2016, CT 09/30/2012  FINDINGS: Cardiovascular: The thoracic aorta is normal in caliber and intact. Pulmonary vasculature is unremarkable, with no evidence of pulmonary embolism. Heart is normal in size and unremarkable. No significant atherosclerotic changes.  Mediastinum/Nodes: No enlarged mediastinal, hilar, or axillary lymph nodes. Thyroid gland, trachea, and esophagus demonstrate no significant findings.  Lungs/Pleura: Minimal linear scarring in the bases. No airspace consolidation. Airways are patent and normal in caliber. No pleural effusions.  Upper Abdomen: Mild fatty infiltration of the liver, incompletely imaged.  Musculoskeletal: No significant skeletal lesion.  Review of the MIP images confirms the above findings.  IMPRESSION: 1. No acute findings in the chest. 2. Hepatic steatosis.   Electronically Signed By: Ellery Plunk M.D. On: 07/08/2016 04:41              ____________________________________________   PROCEDURES  Procedure(s) performed:   Procedures  Critical Care performed:   ____________________________________________   INITIAL IMPRESSION / ASSESSMENT AND PLAN / ED COURSE  Pertinent labs & imaging results that were available during my care of the patient were reviewed  by me and considered in my medical decision making (see chart for details).   ----------------------------------------- 6:22 AM on 07/08/2016 -----------------------------------------  Patient still with persistent pain after nitroglycerin, Ativan and fentanyl. Reassuring lab work thus far. Says that he still "doesn't feel like myself." He will be admitted to the hospital. Patient is understanding of this plan and willing to comply. Side of the doctor Pyreddi.      ____________________________________________   FINAL CLINICAL IMPRESSION(S) / ED DIAGNOSES  Chest pain.    NEW MEDICATIONS STARTED DURING THIS VISIT:  New Prescriptions   No medications on file     Note:  This document was prepared using Dragon voice recognition software and may include unintentional dictation errors.    Myrna Blazer, MD 07/08/16 [phone]

## 2016-07-08 NOTE — Progress Notes (Signed)
Upon seeing patient this morning, he reports not having had any Mountain Dew at 10 PM on 3/5. He has been NPO since. He reports feeling unsteady on his feet. Because of both of the above, we will perform a Lexiscan Myoview. Defer dizziness to IM.

## 2016-07-09 ENCOUNTER — Observation Stay: Payer: Self-pay

## 2016-07-09 DIAGNOSIS — R4182 Altered mental status, unspecified: Secondary | ICD-10-CM

## 2016-07-09 LAB — BASIC METABOLIC PANEL
Anion gap: 8 (ref 5–15)
BUN: 18 mg/dL (ref 6–20)
CHLORIDE: 107 mmol/L (ref 101–111)
CO2: 24 mmol/L (ref 22–32)
CREATININE: 0.69 mg/dL (ref 0.61–1.24)
Calcium: 8.5 mg/dL — ABNORMAL LOW (ref 8.9–10.3)
GFR calc non Af Amer: >60 mL/min (ref >60)
GLUCOSE: 161 mg/dL — AB (ref 65–99)
Potassium: 3.9 mmol/L (ref 3.5–5.1)
Sodium: 139 mmol/L (ref 135–145)

## 2016-07-09 LAB — ECHOCARDIOGRAM COMPLETE
HEIGHTINCHES: 72 in
WEIGHTICAEL: 3611.2 [oz_av]

## 2016-07-09 LAB — LIPID PANEL
Cholesterol: 175 mg/dL (ref 0–200)
HDL: 27 mg/dL — ABNORMAL LOW (ref >40)
LDL Cholesterol: 107 mg/dL — ABNORMAL HIGH (ref 0–99)
TRIGLYCERIDES: 207 mg/dL — AB (ref <150)
Total CHOL/HDL Ratio: 6.5 RATIO
VLDL: 41 mg/dL — ABNORMAL HIGH (ref 0–40)

## 2016-07-09 MED ORDER — LORAZEPAM 2 MG/ML IJ SOLN
INTRAMUSCULAR | Status: AC
  Administered 2016-07-09: 10:00:00
  Filled 2016-07-09: qty 1

## 2016-07-09 MED ORDER — MECLIZINE HCL 25 MG PO TABS
25.0000 mg | ORAL_TABLET | Freq: Three times a day (TID) | ORAL | Status: DC | PRN
  Administered 2016-07-09 – 2016-07-10 (×3): 25 mg via ORAL
  Filled 2016-07-09 (×3): qty 1

## 2016-07-09 MED ORDER — LOPERAMIDE HCL 2 MG PO CAPS
2.0000 mg | ORAL_CAPSULE | ORAL | Status: DC | PRN
  Administered 2016-07-10: 2 mg via ORAL
  Filled 2016-07-09: qty 1

## 2016-07-09 MED ORDER — GADOBENATE DIMEGLUMINE 529 MG/ML IV SOLN
20.0000 mL | Freq: Once | INTRAVENOUS | Status: AC | PRN
  Administered 2016-07-09: 20 mL via INTRAVENOUS

## 2016-07-09 NOTE — Progress Notes (Signed)
Pt refusing to wear bed alarm. Will continue to monitor and assess.

## 2016-07-09 NOTE — Consult Note (Addendum)
Reason for Consult:Episode of unresopnsiveness Referring Physician: Renae Gloss  CC: Episode of unresponsiveness  HPI: Ryan Santiago is an 36 y.o. male with a history of VWD who reports that after having sex last evening he began to have chest pain (10/10) radiating to the left then per wife was unresponsive for a period of time.  Patient presented to the ED where she reports that he had multiple other similar events and was combative.  Although he is reported to have been not acting himself in the ED there is no documentation of frequent episodes of unresponsiveness.  Patient also describes dizziness.   While working with therapy today after being approximately 10 seconds in the L Hallpike position patient's eyes roll back in his head and he becomes non responsive for approximately 5-6 seconds. When patient opens his eyes he will not respond to questions from wife or therapist. He starts to cry and his body starts to shake. He grabs the left side of his chest and states "it hurts."  There was no change in cardiac monitoring during this time.  Consult called fo further evaluation.    Past Medical History:  Diagnosis Date  . Closed left ankle fracture    a. 08/2012: Fall from ladder->Left pilon dislocation and poss compartment syndrome->CRIF, closed manipulation/reduction of pilon/tib/fib, ext fixator, fasciotomies;  b. 09/2012 ORIF dist tib/fib.  . Sinus tachycardia   . Tobacco abuse   . Von Willebrand disease (HCC)     Past Surgical History:  Procedure Laterality Date  . APPLICATION OF WOUND VAC Left 08/28/2012   Procedure: APPLICATION OF WOUND VAC;  Surgeon: Budd Palmer, MD;  Location: [facility] OR;  Service: Orthopedics;  Laterality: Left;; (out on 08/31/2012) (09/28/2012)  . CYSTIC HYGROMA EXCISION  2001   small intestinal removal; "benign" (09/28/2012)  . DORSAL COMPARTMENT RELEASE Left 08/28/2012   Procedure: RELEASE DORSAL COMPARTMENT (DEQUERVAIN);  Surgeon: Budd Palmer, MD;  Location: [facility]  OR;  Service: Orthopedics;  Laterality: Left;  . EXTERNAL FIXATION LEG Left 08/28/2012   Procedure: EXTERNAL FIXATION Left Tibia fuibula fracture;  Surgeon: Budd Palmer, MD;  Location: [facility] OR;  Service: Orthopedics;  Laterality: Left;  . FASCIOTOMY Left 08/30/2012   Procedure: FASCIOTOMY CLOSURE LEFT LEG;  Surgeon: Budd Palmer, MD;  Location: [facility] OR;  Service: Orthopedics;  Laterality: Left;  . KNEE ARTHROSCOPY Right 04/2009  . OPEN REDUCTION INTERNAL FIXATION (ORIF) TIBIA/FIBULA FRACTURE Left 09/28/2012   REMOVING EXTERNAL FIXATOR   . ORIF ANKLE FRACTURE Left 09/28/2012   Procedure: OPEN REDUCTION INTERNAL FIXATION (ORIF) DISTAL TIBIA FRACTURE, REMOVING EXTERNAL FIXATOR;  Surgeon: Budd Palmer, MD;  Location: [facility] OR;  Service: Orthopedics;  Laterality: Left;    Family History  Problem Relation Age of Onset  . Ovarian cancer Mother     alive - early 85's  . Other Father     alive and well.    Social History:  reports that he has quit smoking. His smoking use included E-cigarettes. He has a 16.00 pack-year smoking history. He has never used smokeless tobacco. He reports that he does not drink alcohol or use drugs.  Allergies  Allergen Reactions  . Morphine And Related Anaphylaxis    Arm became swollen, ultimately intubated  . Dilaudid [Hydromorphone Hcl]     Hallucinations and disorientation  . Penicillins Hives    Medications:  I have reviewed the patient's current medications. Prior to Admission:  Prescriptions Prior to Admission  Medication Sig Dispense Refill Last Dose  .  ibuprofen (ADVIL,MOTRIN) 200 MG tablet Take 200 mg by mouth every 6 (six) hours as needed.   prn at prn  . buPROPion (WELLBUTRIN SR) 150 MG 12 hr tablet Take 1 tablet (150 mg total) by mouth 2 (two) times daily. (Patient not taking: Reported on 07/08/2016) 60 tablet 2 Not Taking at Unknown time  . cyclobenzaprine (FLEXERIL) 10 MG tablet Take 1 tablet (10 mg total) by mouth 3 (three) times daily. (Patient  not taking: Reported on 07/08/2016) 80 tablet 1 Not Taking at Unknown time  . docusate sodium 100 MG CAPS Take 100 mg by mouth 2 (two) times daily. (Patient not taking: Reported on 07/08/2016) 10 capsule 0 Not Taking at Unknown time  . enoxaparin (LOVENOX) 40 MG/0.4ML injection Inject 0.4 mLs (40 mg total) into the skin daily. (Patient not taking: Reported on 07/08/2016) 30 Syringe 0 Not Taking at Unknown time  . oxyCODONE (OXY IR/ROXICODONE) 5 MG immediate release tablet Take 1-2 tablets (5-10 mg total) by mouth every 3 (three) hours as needed (breakthrough pain). (Patient not taking: Reported on 07/08/2016) 80 tablet 0 Not Taking at Unknown time  . oxyCODONE-acetaminophen (PERCOCET) 10-325 MG per tablet Take 1-2 tablets by mouth every 6 (six) hours as needed for pain. (Patient not taking: Reported on 07/08/2016) 90 tablet 0 Not Taking at Unknown time  . pregabalin (LYRICA) 75 MG capsule Take 1 capsule (75 mg total) by mouth 3 (three) times daily. (Patient not taking: Reported on 07/08/2016) 90 capsule 1 Not Taking at Unknown time   Scheduled: . aspirin EC  81 mg Oral Daily  . buPROPion  150 mg Oral BID  . cyclobenzaprine  10 mg Oral TID  . docusate sodium  100 mg Oral BID  . enoxaparin (LOVENOX) injection  40 mg Subcutaneous Q24H  . meclizine  25 mg Oral TID  . pregabalin  75 mg Oral TID  . sodium chloride flush  3 mL Intravenous Q12H    ROS: History obtained from the patient  General ROS: negative for - chills, fatigue, fever, night sweats, weight gain or weight loss Psychological ROS: labile  Ophthalmic ROS: negative for - blurry vision, double vision, eye pain or loss of vision ENT ROS: dizziness Allergy and Immunology ROS: negative for - hives or itchy/watery eyes Hematological and Lymphatic ROS: negative for - bleeding problems, bruising or swollen lymph nodes Endocrine ROS: negative for - galactorrhea, hair pattern changes, polydipsia/polyuria or temperature intolerance Respiratory ROS:  negative for - cough, hemoptysis, shortness of breath or wheezing Cardiovascular ROS: as noted in HPI Gastrointestinal ROS: one episode of vomiting Genito-Urinary ROS: negative for - dysuria, hematuria, incontinence or urinary frequency/urgency Musculoskeletal ROS: negative for - joint swelling or muscular weakness Neurological ROS: as noted in HPI Dermatological ROS: negative for rash and skin lesion changes  Physical Examination: Blood pressure (!) 172/90, pulse 88, temperature 97.7 F (36.5 C), temperature source Oral, resp. rate 16, height 6' (1.829 m), weight 102.4 kg (225 lb 11.2 oz), SpO2 98 %.  HEENT-  Normocephalic, no lesions, without obvious abnormality.  Normal external eye and conjunctiva.  Normal TM's bilaterally.  Normal auditory canals and external ears. Normal external nose, mucus membranes and septum.  Normal pharynx. Cardiovascular- S1, S2 normal, pulses palpable throughout   Lungs- chest clear, no wheezing, rales, normal symmetric air entry Abdomen- soft, non-tender; bowel sounds normal; no masses,  no organomegaly Extremities- no edema Lymph-no adenopathy palpable Musculoskeletal-no joint tenderness, deformity or swelling Skin-warm and dry, no hyperpigmentation, vitiligo, or suspicious lesions  Neurological Examination   Mental Status: Alert, oriented, thought content appropriate.  Speech fluent without evidence of aphasia.  Able to follow 3 step commands without difficulty. Apathetic. Cranial Nerves: II: Discs flat bilaterally; Visual fields grossly normal, pupils equal, round, reactive to light and accommodation Santiago,IV, VI: ptosis not present, extra-ocular motions intact bilaterally V,VII: smile symmetric, facial light touch sensation normal bilaterally VIII: hearing normal bilaterally IX,X: gag reflex present XI: bilateral shoulder shrug XII: midline tongue extension Motor: Right : Upper extremity   5/5    Left:     Upper extremity   5/5  Lower extremity    5/5     Lower extremity   5/5.  Immobile at left ankle Tone and bulk:normal tone throughout; no atrophy noted Sensory: Pinprick and light touch intact throughout, bilaterally Deep Tendon Reflexes: 2+ and symmetric throughout Plantars: Right: downgoing   Left: downgoing Cerebellar: Normal finger-to-nose and normal heel-to-shin testing bilaterally Gait: not tested due to safety concerns    Laboratory Studies:   Basic Metabolic Panel:  Recent Labs Lab 07/08/16 0046 07/09/16 0632  NA 143 139  K 3.8 3.9  CL 107 107  CO2 23 24  GLUCOSE 130* 161*  BUN 23* 18  CREATININE 1.07 0.69  CALCIUM 9.2 8.5*    Liver Function Tests:  Recent Labs Lab 07/08/16 0413  AST 24  ALT 26  ALKPHOS 111  BILITOT 0.8  PROT 7.2  ALBUMIN 4.4    Recent Labs Lab 07/08/16 0413  LIPASE 30   No results for input(s): AMMONIA in the last 168 hours.  CBC:  Recent Labs Lab 07/08/16 0046  WBC 17.0*  HGB 15.0  HCT 42.8  MCV 94.3  PLT 249    Cardiac Enzymes:  Recent Labs Lab 07/08/16 0046 07/08/16 0413 07/08/16 1022 07/08/16 1626  TROPONINI <0.03 <0.03 <0.03 <0.03    BNP: Invalid input(s): POCBNP  CBG: No results for input(s): GLUCAP in the last 168 hours.  Microbiology: Results for orders placed or performed during the hospital encounter of 08/28/12  Surgical pcr screen     Status: None   Collection Time: 08/30/12 12:50 AM  Result Value Ref Range Status   MRSA, PCR NEGATIVE NEGATIVE Final   Staphylococcus aureus NEGATIVE NEGATIVE Final    Comment:        The Xpert SA Assay (FDA approved for NASAL specimens in patients over 82 years of age), is one component of a comprehensive surveillance program.  Test performance has been validated by [employer] for patients greater than or equal to 82 year old. It is not intended to diagnose infection nor to guide or monitor treatment.    Coagulation Studies: No results for input(s): LABPROT, INR in the last 72  hours.  Urinalysis: No results for input(s): COLORURINE, LABSPEC, PHURINE, GLUCOSEU, HGBUR, BILIRUBINUR, KETONESUR, PROTEINUR, UROBILINOGEN, NITRITE, LEUKOCYTESUR in the last 168 hours.  Invalid input(s): APPERANCEUR  Lipid Panel:     Component Value Date/Time   CHOL 175 07/09/2016 0632   TRIG 207 (H) 07/09/2016 0632   HDL 27 (L) 07/09/2016 0632   CHOLHDL 6.5 07/09/2016 0632   VLDL 41 (H) 07/09/2016 0632   LDLCALC 107 (H) 07/09/2016 0632    HgbA1C: No results found for: HGBA1C  Urine Drug Screen:     Component Value Date/Time   LABOPIA NONE DETECTED 07/08/2016 0413   LABOPIA NONE DETECTED 08/28/2012 2305   COCAINSCRNUR NONE DETECTED 07/08/2016 0413   LABBENZ NONE DETECTED 07/08/2016 0413   LABBENZ POSITIVE (A) 08/28/2012  2305   AMPHETMU NONE DETECTED 07/08/2016 0413   AMPHETMU NONE DETECTED 08/28/2012 2305   THCU NONE DETECTED 07/08/2016 0413   THCU NONE DETECTED 08/28/2012 2305   LABBARB NONE DETECTED 07/08/2016 0413   LABBARB NONE DETECTED 08/28/2012 2305    Alcohol Level:  Recent Labs Lab 07/08/16 0413  ETH <5    Other results: EKG: sinus tachycardia at 107 bpm.  Imaging: Dg Chest 2 View  Result Date: 07/08/2016 CLINICAL DATA:  Sudden onset left-sided chest pain with nausea. Dyspnea. EXAM: CHEST  2 VIEW COMPARISON:  08/28/2012 FINDINGS: Shallow inspiration. The lungs are clear. Pulmonary vasculature is normal. Hilar, mediastinal and cardiac contours are unremarkable and unchanged. IMPRESSION: No active cardiopulmonary disease. Electronically Signed   By: Ellery Plunkaniel R Mitchell M.D.   On: 07/08/2016 01:12   Nm Myocar Multi W/spect W/wall Motion / Ef  Result Date: 07/08/2016  Low risk study without evidence of ischemia or scar.  The left ventricular ejection fraction is normal (55-65%).    Ct Angio Chest Aorta W And/or Wo Contrast  Result Date: 07/08/2016 CLINICAL DATA:  Sudden onset left-sided chest pain with nausea and dyspnea. The pain radiates into his left neck  and left upper extremity. EXAM: CT ANGIOGRAPHY CHEST WITH CONTRAST TECHNIQUE: Multidetector CT imaging of the chest was performed using the standard protocol during bolus administration of intravenous contrast. Multiplanar CT image reconstructions and MIPs were obtained to evaluate the vascular anatomy. CONTRAST:  100 mL Isovue 370 intravenous COMPARISON:  Radiographs 07/08/2016, CT 09/30/2012 FINDINGS: Cardiovascular: The thoracic aorta is normal in caliber and intact. Pulmonary vasculature is unremarkable, with no evidence of pulmonary embolism. Heart is normal in size and unremarkable. No significant atherosclerotic changes. Mediastinum/Nodes: No enlarged mediastinal, hilar, or axillary lymph nodes. Thyroid gland, trachea, and esophagus demonstrate no significant findings. Lungs/Pleura: Minimal linear scarring in the bases. No airspace consolidation. Airways are patent and normal in caliber. No pleural effusions. Upper Abdomen: Mild fatty infiltration of the liver, incompletely imaged. Musculoskeletal: No significant skeletal lesion. Review of the MIP images confirms the above findings. IMPRESSION: 1. No acute findings in the chest. 2. Hepatic steatosis. Electronically Signed   By: Ellery Plunkaniel R Mitchell M.D.   On: 07/08/2016 04:41     Assessment/Plan: 36 year old male with a history of VWD who presents with episodes of dizziness and unresponsiveness associated with chest pain.  Patient was orthostatic with testing but the majority of his episodes occur while seated or lying down.  Episodes also associated with multiple psychological features.  No changes noted on cardiac monitoring.  Neurological exam nonfocal.  Will further work up possible CNS etiology.  Recommendations: 1.  MRI of the brain  2.  EEG   Thana FarrLeslie Wandy Bossler, MD Neurology [phone] 07/09/2016, 12:16 PM

## 2016-07-09 NOTE — Evaluation (Signed)
Physical Therapy Evaluation Patient Details Name: Frances FurbishGeorge C Muto III MRN: [id number] DOB: 07/18/1980 Today's Date: 07/09/2016   History of Present Illness  Coral SpikesGeorge Tober is a 36 y.o. male with a known history of Tobacco abuse. He also reports that he was diagnosed with Von Willebrand disease but later states he tested negative. He presented to the emergency room with chest pain. The chest pain started around 10 PM last night was left-sided. Patient noticed the chest pain after he had sex last night. The pain is sharp in nature 10 out of 10 on a scale of 1-10 located in the left side of the chest. The pain radiated to the left arm and left side of the neck. No complaints of any shortness of breath, orthopnea and palpitations in the emergency department. No fever or chills and cough. Troponins cycled and negative. EKG showed sinus tachycardia. Patient was worked up with CT angiogram of the chest which showed no pulmonary embolism. Hospitalist service was consulted for further care of the patient. Pt is now admitted under observation status for chest pain of undermined etiology as well as headache with dizziness. At time of PT evaluation pt reports complete resolution of chest pain. He is complaining of some dizziness and lightheadedness when changing positions from sitting to stand that only lasts for a few seconds. Wife reports that pt was agitated and confused while in the ER trying to pull at his clothes and staff. He denies any abdominal or back pain at time of PT evaluation. He denies any illicit substances or herbal supplements. No recent medication changes or other changes in his overall health  Clinical Impression  Pt is independent with all bed mobility, transfers, and ambulation. During transfers he demonstrates mild trunk sway initially upon standing with reports of some lightheadedness and blurring of his vision. Orthostatic vitals obtained and BP actually increases from supine to sitting but is  orthostatic positive with a drop of 22mmHg from sitting to standing. However, his symptoms resolve within 4-5 seconds and pt is able to continue ambulation. He is able to ambulate partially around the RN station and back to room without significant difficulty. However, he does have one bout where he states that his vision becomes fuzzy and has to hold the chair rail for a few seconds. Pt reports that during ambulation with therapist he feels fatigued, as if he has been "working out." Heart rate and SaO2 WNL throughout entire ambulation distance. Bedside neurological screening is negative for significant findings. Bedside vestibular screening reveals a positive VOR test for blurring however VOR thrust, finger follow, saccades, VOR cancellation, and post-headshake nystagmus are all negative. Pt with negative R Dix-Hallpike test however he reports some dizziness when returning from supine to sitting. Negative L Dix-Hallpike test for vertigo or nystagmus. However after approximately 10 seconds in the L Hallpike position patient's eyes roll back in his head and he becomes non responsive for approximately 5-6 seconds. When patient opens his eyes he will not respond to questions from wife or therapist. He starts to cry and his body starts to shake. He grabs the left side of his chest and states "it hurts." Rapid called and RN comes into room to assist. No loss of bowel/bladder noted. Pt rolls onto his R side and he becomes agitated, attempting to get out of bed and not responding to questions. Wife reports that this is the same behavior patient was exhibiting in the emergency department. Vitals obtained and BP is elevated but otherwise VSS. Pt  slowly becomes more responsive over the next 5 minutes. MD arrives and therapist describes the event. Pt has no PT related needs at this time. His current order will be completed. Please enter new order if status or needs change.     Follow Up Recommendations No PT follow up     Equipment Recommendations  None recommended by PT    Recommendations for Other Services       Precautions / Restrictions Precautions Precautions: Fall Restrictions Weight Bearing Restrictions: No      Mobility  Bed Mobility Overal bed mobility: Independent             General bed mobility comments: Good speed and sequencing  Transfers Overall transfer level: Independent Equipment used: None             General transfer comment: Pt demonstrates good speed and sequencing. He demonstrates mild trunk sway initially upon standing with reported lightheadedness. Vitals obtained and pt is orthostatic positive from sitting to standing. Symptoms resolve within 4-5 seconds  Ambulation/Gait Ambulation/Gait assistance: Supervision Ambulation Distance (Feet): 200 Feet Assistive device: None Gait Pattern/deviations: WFL(Within Functional Limits) Gait velocity: WFL Gait velocity interpretation: >2.62 ft/sec, indicative of independent community ambulator General Gait Details: Pt able to ambulate partially around the RN station and back to room without difficulty. Pt reports that during normal ambulation he feels as if he has been working out. Heart rate and SaO2 normal throughout ambulation. Pt reports that his mobility is baseline. He is able to perform horizontal and vertical head turns without lateral deviation or slowing of gait. He has one episode where he reports that his vision becomes "fuzzy" and he has to hold onto the chair rail on the wall. Symptoms resolve within 3-4 seconds and pt is able to continue ambulating. Decreased L ankle dorsiflexion noted during gait which is chronic prior to admission  Stairs            Wheelchair Mobility    Modified Rankin (Stroke Patients Only)       Balance Overall balance assessment: Independent Sitting-balance support: No upper extremity supported Sitting balance-Leahy Scale: Normal     Standing balance support: No upper  extremity supported Standing balance-Leahy Scale: Normal Standing balance comment: Negative Rhomberg for loss of balance however pt does demonstrate increased trunk sway during testing                             Pertinent Vitals/Pain Pain Assessment: No/denies pain (Denies chest pain upon arrival)    Home Living Family/patient expects to be discharged to:: Private residence Living Arrangements: Spouse/significant other Available Help at Discharge: Family Type of Home: House Home Access: Stairs to enter Entrance Stairs-Rails: Doctor, general practice of Steps: 3 Home Layout: One level Home Equipment: Shower seat - built in (No wheelchair or cane. No other equipment)      Prior Function Level of Independence: Independent         Comments: Independent with ADLs/IADLs. 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 Limited L ankle AROM/PROM which is chronic due to hardware  from prior surgery       Communication   Communication: No difficulties  Cognition Arousal/Alertness: Awake/alert Behavior During Therapy: WFL for tasks assessed/performed Overall Cognitive Status: Within Functional Limits for tasks assessed                      General Comments      Exercises     Assessment/Plan    PT Assessment Patent does not need any further PT services  PT Problem List         PT Treatment Interventions      PT Goals (Current goals can be found in the Care Plan section)  Acute Rehab PT Goals PT Goal Formulation: All assessment  and education complete, DC therapy    Frequency     Barriers to discharge        Co-evaluation               End of Session Equipment Utilized During Treatment: Gait belt Activity Tolerance: Patient tolerated treatment well Patient left: in bed;with call bell/phone within reach;with bed alarm set;with nursing/sitter in room;Other (comment) (MD in room assessing patient) Nurse Communication: Other (comment) (RN arrives to assist due to acute agitation) PT Visit Diagnosis: Unsteadiness on feet (R26.81)    Functional Assessment Tool Used: (P) AM-PAC 6 Clicks Basic Mobility Functional Limitation: (P) Mobility: Walking and moving around    Time: 9562-1308 PT Time Calculation (min) (ACUTE ONLY): 42 min   Charges:   PT Evaluation $PT Eval Moderate Complexity: 1 Procedure PT Treatments $Neuromuscular Re-education: 8-22 mins   PT G Codes:   PT G-Codes **NOT FOR INPATIENT CLASS** Functional Assessment Tool Used: (P) AM-PAC 6 Clicks Basic Mobility Functional Limitation: (P) Mobility: Walking and moving around    [employer] PT, DPT   Maysie Parkhill 07/09/2016, 10:21 AM

## 2016-07-09 NOTE — Progress Notes (Signed)
Patient ID: Ryan Santiago, male   DOB: 1981/01/24, 36 y.o.   MRN: [id number]  Patient ID: Ryan Santiago, male   DOB: February 12, 1981, 36 y.o.   MRN: [id number]  [facility] PROGRESS NOTE  [name] NWG:[id number] DOB: 06-28-1980 DOA: 07/08/2016 PCP: No PCP Per Patient  HPI/Subjective: Patient felt better than yesterday but still not well. Patient seen this morning and was still having feelings of dizziness especially when he stands up. Was also having pain in his right groin. Still having some headache. Did not feel well. I got called into the room urgently this morning about a possible seizure where he was walking with physical therapy and then blanked out for a minute and then had some shaking episode. No loss of bowel or bladder function and no tongue biting.   Objective: Vitals:   07/09/16 0857 07/09/16 0923  BP: (!) 159/97 (!) 172/90  Pulse: 86 88  Resp:    Temp:      Filed Weights   07/08/16 0040 07/08/16 0752  Weight: 101.2 kg (223 lb) 102.4 kg (225 lb 11.2 oz)    ROS: Review of Systems  Constitutional: Negative for chills and fever.  Eyes: Positive for blurred vision.  Respiratory: Positive for shortness of breath. Negative for cough.   Cardiovascular: Positive for chest pain and palpitations.  Gastrointestinal: Positive for nausea and vomiting. Negative for abdominal pain, constipation and diarrhea.  Genitourinary: Negative for dysuria.  Musculoskeletal: Negative for joint pain.  Neurological: Positive for dizziness and headaches.   Exam: Physical Exam  Constitutional: He is oriented to person, place, and time.  HENT:  Nose: No mucosal edema.  Mouth/Throat: No oropharyngeal exudate or posterior oropharyngeal edema.  Eyes: Conjunctivae, EOM and lids are normal. Pupils are equal, round, and reactive to light.  Patient got dizzy with IV movements  Neck: No JVD present. Carotid bruit is not present. No edema present. No thyroid mass and no thyromegaly  present.  Cardiovascular: S1 normal and S2 normal.  Exam reveals no gallop.   No murmur heard. Pulses:      Dorsalis pedis pulses are 2+ on the right side, and 2+ on the left side.  Respiratory: No respiratory distress. He has no wheezes. He has no rhonchi. He has no rales.  GI: Soft. Bowel sounds are normal. There is no tenderness.  Musculoskeletal:       Right ankle: He exhibits no swelling.       Left ankle: He exhibits no swelling.  Lymphadenopathy:    He has no cervical adenopathy.  Neurological: He is alert and oriented to person, place, and time. No cranial nerve deficit.  Epley maneuver put him through a little bit of dizziness. Little dizziness with Romberg.  Skin: Skin is warm. No rash noted. Nails show no clubbing.  Psychiatric: He has a normal mood and affect.      Data Reviewed: Basic Metabolic Panel:  Recent Labs Lab 07/08/16 0046 07/09/16 0632  NA 143 139  K 3.8 3.9  CL 107 107  CO2 23 24  GLUCOSE 130* 161*  BUN 23* 18  CREATININE 1.07 0.69  CALCIUM 9.2 8.5*   Liver Function Tests:  Recent Labs Lab 07/08/16 0413  AST 24  ALT 26  ALKPHOS 111  BILITOT 0.8  PROT 7.2  ALBUMIN 4.4    Recent Labs Lab 07/08/16 0413  LIPASE 30   CBC:  Recent Labs Lab 07/08/16 0046  WBC 17.0*  HGB 15.0  HCT  42.8  MCV 94.3  PLT 249   Cardiac Enzymes:  Recent Labs Lab 07/08/16 0046 07/08/16 0413 07/08/16 1022 07/08/16 1626  TROPONINI <0.03 <0.03 <0.03 <0.03     Studies: Dg Chest 2 View  Result Date: 07/08/2016 CLINICAL DATA:  Sudden onset left-sided chest pain with nausea. Dyspnea. EXAM: CHEST  2 VIEW COMPARISON:  08/28/2012 FINDINGS: Shallow inspiration. The lungs are clear. Pulmonary vasculature is normal. Hilar, mediastinal and cardiac contours are unremarkable and unchanged. IMPRESSION: No active cardiopulmonary disease. Electronically Signed   By: Ellery Plunkaniel R Mitchell M.D.   On: 07/08/2016 01:12   Mr Ryan Santiago  Result Date:  07/09/2016 CLINICAL DATA:  36 year old male with acute onset headache nausea and left shoulder pain on the evening of presentation. Persistent headache and dizziness. Initial encounter. EXAM: MRI HEAD WITHOUT AND WITH Santiago MRA HEAD WITHOUT Santiago TECHNIQUE: Multiplanar, multiecho pulse sequences of the brain and surrounding structures were obtained without and with intravenous Santiago. Angiographic images of the head were obtained using MRA technique without Santiago. Santiago:  20mL MULTIHANCE GADOBENATE DIMEGLUMINE 529 MG/ML IV SOLN [facility] Santiago [facility] FINDINGS: MRI HEAD FINDINGS Study is intermittently degraded by motion artifact despite repeated imaging attempts. Brain: Cerebral volume is within normal limits. No restricted diffusion to suggest acute infarction. No midline shift, mass effect, evidence of mass lesion, ventriculomegaly, extra-axial collection or acute intracranial hemorrhage. Cervicomedullary junction and pituitary are within normal limits. Wallace CullensGray and white matter signal is within normal limits throughout the brain. No encephalomalacia or chronic cerebral blood products identified. No abnormal enhancement identified. No dural thickening. Vascular: Major intracranial vascular flow voids are preserved, the distal left vertebral artery appears dominant. Dural venous sinuses appear to be normally enhancing. Skull and upper cervical spine: Grossly negative. Sinuses/Orbits: Orbits soft tissues appear normal. Small right maxillary sinus mucous retention cyst. Otherwise Visualized paranasal sinuses are well pneumatized. Negative scalp soft tissues. Other: Dedicated internal auditory canal imaging. The cerebellopontine angles appear normal. Normal bilateral cisternal and intracanalicular 7th and 8th cranial nerve segments. Preserved T2 signal in the bilateral cochlea and vestibular structures. Mastoids are clear. Normal stylomastoid foramina. No abnormal  enhancement identified. Parotid glands appear normal. MRA HEAD FINDINGS Antegrade flow in the posterior circulation. The distal left vertebral artery is dominant. The tail of the distal right vertebral artery is degraded by motion but I suspect that vessel remains patent. Patent basilar artery. Normal PCA origins. Posterior communicating arteries are diminutive or absent. Bilateral PCA branches are within normal limits. Antegrade flow in both ICA siphons. The left siphon appears mildly dominant. Patent carotid termini. Normal left MCA and ACA origin while the right A1 is diminutive or absent. The right MCA origin appears normal. No siphon stenosis identified. Azygos type ACA A2 anatomy. MCA M1 segments are patent. Visible MCA and ACA branches are grossly normal. IMPRESSION: 1. Intermittently degraded by motion despite repeated imaging attempts. 2. No acute intracranial abnormality. Negative MRI appearance of the brain with dedicated IAC imaging. 3. Negative intracranial MRA allowing for motion artifact. Electronically Signed   By: Odessa FlemingH  Hall M.D.   On: 07/09/2016 13:31   Mr Laqueta JeanBrain W WUWo Santiago  Result Date: 07/09/2016 CLINICAL DATA:  36 year old male with acute onset headache nausea and left shoulder pain on the evening of presentation. Persistent headache and dizziness. Initial encounter. EXAM: MRI HEAD WITHOUT AND WITH Santiago MRA HEAD WITHOUT Santiago TECHNIQUE: Multiplanar, multiecho pulse sequences of the brain and surrounding structures were obtained without and with  intravenous Santiago. Angiographic images of the head were obtained using MRA technique without Santiago. Santiago:  20mL MULTIHANCE GADOBENATE DIMEGLUMINE 529 MG/ML IV SOLN COMPARISON:  [facility] CT without Santiago 12/07/2007 FINDINGS: MRI HEAD FINDINGS Study is intermittently degraded by motion artifact despite repeated imaging attempts. Brain: Cerebral volume is within normal limits. No restricted diffusion to suggest acute  infarction. No midline shift, mass effect, evidence of mass lesion, ventriculomegaly, extra-axial collection or acute intracranial hemorrhage. Cervicomedullary junction and pituitary are within normal limits. Wallace Cullens and white matter signal is within normal limits throughout the brain. No encephalomalacia or chronic cerebral blood products identified. No abnormal enhancement identified. No dural thickening. Vascular: Major intracranial vascular flow voids are preserved, the distal left vertebral artery appears dominant. Dural venous sinuses appear to be normally enhancing. Skull and upper cervical spine: Grossly negative. Sinuses/Orbits: Orbits soft tissues appear normal. Small right maxillary sinus mucous retention cyst. Otherwise Visualized paranasal sinuses are well pneumatized. Negative scalp soft tissues. Other: Dedicated internal auditory canal imaging. The cerebellopontine angles appear normal. Normal bilateral cisternal and intracanalicular 7th and 8th cranial nerve segments. Preserved T2 signal in the bilateral cochlea and vestibular structures. Mastoids are clear. Normal stylomastoid foramina. No abnormal enhancement identified. Parotid glands appear normal. MRA HEAD FINDINGS Antegrade flow in the posterior circulation. The distal left vertebral artery is dominant. The tail of the distal right vertebral artery is degraded by motion but I suspect that vessel remains patent. Patent basilar artery. Normal PCA origins. Posterior communicating arteries are diminutive or absent. Bilateral PCA branches are within normal limits. Antegrade flow in both ICA siphons. The left siphon appears mildly dominant. Patent carotid termini. Normal left MCA and ACA origin while the right A1 is diminutive or absent. The right MCA origin appears normal. No siphon stenosis identified. Azygos type ACA A2 anatomy. MCA M1 segments are patent. Visible MCA and ACA branches are grossly normal. IMPRESSION: 1. Intermittently degraded by  motion despite repeated imaging attempts. 2. No acute intracranial abnormality. Negative MRI appearance of the brain with dedicated IAC imaging. 3. Negative intracranial MRA allowing for motion artifact. Electronically Signed   By: Odessa Fleming M.D.   On: 07/09/2016 13:31   Nm Myocar Multi W/spect W/wall Motion / Ef  Result Date: 07/08/2016  Low risk study without evidence of ischemia or scar.  The left ventricular ejection fraction is normal (55-65%).    Ct Angio Chest Aorta W And/or Wo Santiago  Result Date: 07/08/2016 CLINICAL DATA:  Sudden onset left-sided chest pain with nausea and dyspnea. The pain radiates into his left neck and left upper extremity. EXAM: CT ANGIOGRAPHY CHEST WITH Santiago TECHNIQUE: Multidetector CT imaging of the chest was performed using the standard protocol during bolus administration of intravenous Santiago. Multiplanar CT image reconstructions and MIPs were obtained to evaluate the vascular anatomy. Santiago:  100 mL Isovue 370 intravenous COMPARISON:  Radiographs 07/08/2016, CT 09/30/2012 FINDINGS: Cardiovascular: The thoracic aorta is normal in caliber and intact. Pulmonary vasculature is unremarkable, with no evidence of pulmonary embolism. Heart is normal in size and unremarkable. No significant atherosclerotic changes. Mediastinum/Nodes: No enlarged mediastinal, hilar, or axillary lymph nodes. Thyroid gland, trachea, and esophagus demonstrate no significant findings. Lungs/Pleura: Minimal linear scarring in the bases. No airspace consolidation. Airways are patent and normal in caliber. No pleural effusions. Upper Abdomen: Mild fatty infiltration of the liver, incompletely imaged. Musculoskeletal: No significant skeletal lesion. Review of the MIP images confirms the above findings. IMPRESSION: 1. No acute findings in the chest. 2. Hepatic  steatosis. Electronically Signed   By: Ellery Plunk M.D.   On: 07/08/2016 04:41    Scheduled Meds: . enoxaparin (LOVENOX) injection   40 mg Subcutaneous Q24H  . meclizine  25 mg Oral TID  . sodium chloride flush  3 mL Intravenous Q12H   Continuous Infusions: . sodium chloride 100 mL/hr at 07/09/16 1422    Assessment/Plan:  1. Headache and dizziness. Could be a viral labyrinthitis. Continue Antivert but make it when necessary. Continue IV fluid hydration. MRI and MRA of the brain were negative. EEG was negative for seizure but fell asleep easily. 2. Discussed with wife that he does not take Wellbutrin or Lyrica at home. Stop both of these medications.  3. History of von Willebrand's disease 4. Leukocytosis probably secondary to vomiting and steroids given yesterday.  Code Status:     Code Status Orders        Start     Ordered   07/08/16 0810  Full code  Continuous     07/08/16 0809    Code Status History    Date Active Date Inactive Code Status Order ID Comments User Context   09/28/2012  4:23 PM 10/01/2012  2:29 PM Full Code [id number]  Mearl Latin, PA-C Inpatient     Family Communication: Wife at the bedside Disposition Plan: Potentially home tomorrow  Consultants:  Cardiology  Neurology  Time spent: 35 minutes  Alford Highland  [employer]

## 2016-07-10 DIAGNOSIS — R55 Syncope and collapse: Secondary | ICD-10-CM

## 2016-07-10 LAB — BASIC METABOLIC PANEL
ANION GAP: 5 (ref 5–15)
BUN: 11 mg/dL (ref 6–20)
CALCIUM: 8 mg/dL — AB (ref 8.9–10.3)
CHLORIDE: 106 mmol/L (ref 101–111)
CO2: 26 mmol/L (ref 22–32)
Creatinine, Ser: 0.73 mg/dL (ref 0.61–1.24)
GFR calc non Af Amer: >60 mL/min (ref >60)
Glucose, Bld: 99 mg/dL (ref 65–99)
Potassium: 3.5 mmol/L (ref 3.5–5.1)
Sodium: 137 mmol/L (ref 135–145)

## 2016-07-10 LAB — CBC
HCT: 38.6 % — ABNORMAL LOW (ref 40.0–52.0)
HEMOGLOBIN: 13.4 g/dL (ref 13.0–18.0)
MCH: 33.1 pg (ref 26.0–34.0)
MCHC: 34.8 g/dL (ref 32.0–36.0)
MCV: 95.1 fL (ref 80.0–100.0)
Platelets: 216 10*3/uL (ref 150–440)
RBC: 4.06 MIL/uL — AB (ref 4.40–5.90)
RDW: 13.2 % (ref 11.5–14.5)
WBC: 8.8 10*3/uL (ref 3.8–10.6)

## 2016-07-10 LAB — C DIFFICILE QUICK SCREEN W PCR REFLEX
C DIFFICILE (CDIFF) TOXIN: NEGATIVE
C DIFFICLE (CDIFF) ANTIGEN: NEGATIVE
C Diff interpretation: NOT DETECTED

## 2016-07-10 MED ORDER — LORAZEPAM 0.5 MG PO TABS
0.5000 mg | ORAL_TABLET | Freq: Three times a day (TID) | ORAL | Status: DC
  Administered 2016-07-10 – 2016-07-11 (×3): 0.5 mg via ORAL
  Filled 2016-07-10 (×3): qty 1

## 2016-07-10 MED ORDER — GI COCKTAIL ~~LOC~~
30.0000 mL | Freq: Once | ORAL | Status: AC
  Administered 2016-07-10: 30 mL via ORAL
  Filled 2016-07-10: qty 30

## 2016-07-10 MED ORDER — POTASSIUM CHLORIDE CRYS ER 20 MEQ PO TBCR
40.0000 meq | EXTENDED_RELEASE_TABLET | Freq: Once | ORAL | Status: AC
  Administered 2016-07-10: 40 meq via ORAL
  Filled 2016-07-10: qty 2

## 2016-07-10 MED ORDER — LORAZEPAM 0.5 MG PO TABS
0.5000 mg | ORAL_TABLET | ORAL | Status: AC
  Administered 2016-07-10: 0.5 mg via ORAL
  Filled 2016-07-10: qty 1

## 2016-07-10 MED ORDER — MECLIZINE HCL 25 MG PO TABS
25.0000 mg | ORAL_TABLET | Freq: Three times a day (TID) | ORAL | Status: DC
  Administered 2016-07-10 – 2016-07-11 (×3): 25 mg via ORAL
  Filled 2016-07-10 (×3): qty 1

## 2016-07-10 NOTE — Progress Notes (Signed)
Patient ID: Ryan Santiago, male   DOB: July 17, 1980, 36 y.o.   MRN: [id number]  [facility] PROGRESS NOTE  Ryan Santiago WUJ:[id number] DOB: 1980/09/24 DOA: 07/08/2016 PCP: No PCP Per Patient  HPI/Subjective: Patient with lots of complaints. Had diarrhea all night. Feels dizzy when standing up. Feels exhausted after standing up. Gets headache when he sits down.  Objective: Vitals:   07/10/16 0747 07/10/16 1117  BP: (!) 140/98 138/83  Pulse: 77 (!) 50  Resp:  18  Temp: 97.7 F (36.5 C) 98.1 F (36.7 C)    Filed Weights   07/08/16 0040 07/08/16 0752 07/10/16 0531  Weight: 101.2 kg (223 lb) 102.4 kg (225 lb 11.2 oz) 104.5 kg (230 lb 4.8 oz)    ROS: Review of Systems  Constitutional: Negative for chills and fever.  Eyes: Negative for blurred vision.  Respiratory: Negative for cough and shortness of breath.   Cardiovascular: Negative for chest pain.  Gastrointestinal: Negative for abdominal pain, constipation, diarrhea, nausea and vomiting.  Genitourinary: Negative for dysuria.  Musculoskeletal: Positive for myalgias. Negative for joint pain.  Neurological: Positive for weakness. Negative for dizziness and headaches.   Exam: Physical Exam  Constitutional: He is oriented to person, place, and time.  HENT:  Nose: No mucosal edema.  Mouth/Throat: No oropharyngeal exudate or posterior oropharyngeal edema.  Eyes: Conjunctivae, EOM and lids are normal. Pupils are equal, round, and reactive to light.  Neck: No JVD present. Carotid bruit is not present. No edema present. No thyroid mass and no thyromegaly present.  Cardiovascular: S1 normal and S2 normal.  Exam reveals no gallop.   No murmur heard. Pulses:      Dorsalis pedis pulses are 2+ on the right side, and 2+ on the left side.  Respiratory: No respiratory distress. He has no wheezes. He has no rhonchi. He has no rales.  GI: Soft. Bowel sounds are normal. There is no tenderness.  Musculoskeletal:       Right  ankle: He exhibits no swelling.       Left ankle: He exhibits no swelling.  Lymphadenopathy:    He has no cervical adenopathy.  Neurological: He is alert and oriented to person, place, and time. No cranial nerve deficit.  Skin: Skin is warm. No rash noted. Nails show no clubbing.  Psychiatric: His mood appears anxious.      Data Reviewed: Basic Metabolic Panel:  Recent Labs Lab 07/08/16 0046 07/09/16 0632 07/10/16 0845  NA 143 139 137  K 3.8 3.9 3.5  CL 107 107 106  CO2 23 24 26   GLUCOSE 130* 161* 99  BUN 23* 18 11  CREATININE 1.07 0.69 0.73  CALCIUM 9.2 8.5* 8.0*   Liver Function Tests:  Recent Labs Lab 07/08/16 0413  AST 24  ALT 26  ALKPHOS 111  BILITOT 0.8  PROT 7.2  ALBUMIN 4.4    Recent Labs Lab 07/08/16 0413  LIPASE 30   CBC:  Recent Labs Lab 07/08/16 0046 07/10/16 0845  WBC 17.0* 8.8  HGB 15.0 13.4  HCT 42.8 38.6*  MCV 94.3 95.1  PLT 249 216   Cardiac Enzymes:  Recent Labs Lab 07/08/16 0046 07/08/16 0413 07/08/16 1022 07/08/16 1626  TROPONINI <0.03 <0.03 <0.03 <0.03    Recent Results (from the past 240 hour(s))  C difficile quick scan w PCR reflex     Status: None   Collection Time: 07/10/16  8:13 AM  Result Value Ref Range Status   C Diff antigen NEGATIVE  NEGATIVE Final   C Diff toxin NEGATIVE NEGATIVE Final   C Diff interpretation No C. difficile detected.  Final     Studies: Mr Shirlee LatchMra Head WRWo Contrast  Result Date: 07/09/2016 CLINICAL DATA:  36 year old male with acute onset headache nausea and left shoulder pain on the evening of presentation. Persistent headache and dizziness. Initial encounter. EXAM: MRI HEAD WITHOUT AND WITH CONTRAST MRA HEAD WITHOUT CONTRAST TECHNIQUE: Multiplanar, multiecho pulse sequences of the brain and surrounding structures were obtained without and with intravenous contrast. Angiographic images of the head were obtained using MRA technique without contrast. CONTRAST:  20mL MULTIHANCE GADOBENATE  DIMEGLUMINE 529 MG/ML IV SOLN [facility]  [facility] FINDINGS: MRI HEAD FINDINGS Study is intermittently degraded by motion artifact despite repeated imaging attempts. Brain: Cerebral volume is within normal limits. No restricted diffusion to suggest acute infarction. No midline shift, mass effect, evidence of mass lesion, ventriculomegaly, extra-axial collection or acute intracranial hemorrhage. Cervicomedullary junction and pituitary are within normal limits. Wallace CullensGray and white matter signal is within normal limits throughout the brain. No encephalomalacia or chronic cerebral blood products identified. No abnormal enhancement identified. No dural thickening. Vascular: Major intracranial vascular flow voids are preserved, the distal left vertebral artery appears dominant. Dural venous sinuses appear to be normally enhancing. Skull and upper cervical spine: Grossly negative. Sinuses/Orbits: Orbits soft tissues appear normal. Small right maxillary sinus mucous retention cyst. Otherwise Visualized paranasal sinuses are well pneumatized. Negative scalp soft tissues. Other: Dedicated internal auditory canal imaging. The cerebellopontine angles appear normal. Normal bilateral cisternal and intracanalicular 7th and 8th cranial nerve segments. Preserved T2 signal in the bilateral cochlea and vestibular structures. Mastoids are clear. Normal stylomastoid foramina. No abnormal enhancement identified. Parotid glands appear normal. MRA HEAD FINDINGS Antegrade flow in the posterior circulation. The distal left vertebral artery is dominant. The tail of the distal right vertebral artery is degraded by motion but I suspect that vessel remains patent. Patent basilar artery. Normal PCA origins. Posterior communicating arteries are diminutive or absent. Bilateral PCA branches are within normal limits. Antegrade flow in both ICA siphons. The left siphon appears mildly dominant. Patent carotid  termini. Normal left MCA and ACA origin while the right A1 is diminutive or absent. The right MCA origin appears normal. No siphon stenosis identified. Azygos type ACA A2 anatomy. MCA M1 segments are patent. Visible MCA and ACA branches are grossly normal. IMPRESSION: 1. Intermittently degraded by motion despite repeated imaging attempts. 2. No acute intracranial abnormality. Negative MRI appearance of the brain with dedicated IAC imaging. 3. Negative intracranial MRA allowing for motion artifact. Electronically Signed   By: Odessa FlemingH  Hall M.D.   On: 07/09/2016 13:31   Mr Laqueta JeanBrain W UEWo Contrast  Result Date: 07/09/2016 CLINICAL DATA:  36 year old male with acute onset headache nausea and left shoulder pain on the evening of presentation. Persistent headache and dizziness. Initial encounter. EXAM: MRI HEAD WITHOUT AND WITH CONTRAST MRA HEAD WITHOUT CONTRAST TECHNIQUE: Multiplanar, multiecho pulse sequences of the brain and surrounding structures were obtained without and with intravenous contrast. Angiographic images of the head were obtained using MRA technique without contrast. CONTRAST:  20mL MULTIHANCE GADOBENATE DIMEGLUMINE 529 MG/ML IV SOLN [facility]  [facility] contrast 12/[facility]/2007 FINDINGS: MRI HEAD FINDINGS Study is intermittently degraded by motion artifact despite repeated imaging attempts. Brain: Cerebral volume is within normal limits. No restricted diffusion to suggest acute infarction. No midline shift, mass effect, evidence of mass lesion, ventriculomegaly, extra-axial collection or acute  intracranial hemorrhage. Cervicomedullary junction and pituitary are within normal limits. Wallace Cullens and white matter signal is within normal limits throughout the brain. No encephalomalacia or chronic cerebral blood products identified. No abnormal enhancement identified. No dural thickening. Vascular: Major intracranial vascular flow voids are preserved, the distal left vertebral artery appears dominant.  Dural venous sinuses appear to be normally enhancing. Skull and upper cervical spine: Grossly negative. Sinuses/Orbits: Orbits soft tissues appear normal. Small right maxillary sinus mucous retention cyst. Otherwise Visualized paranasal sinuses are well pneumatized. Negative scalp soft tissues. Other: Dedicated internal auditory canal imaging. The cerebellopontine angles appear normal. Normal bilateral cisternal and intracanalicular 7th and 8th cranial nerve segments. Preserved T2 signal in the bilateral cochlea and vestibular structures. Mastoids are clear. Normal stylomastoid foramina. No abnormal enhancement identified. Parotid glands appear normal. MRA HEAD FINDINGS Antegrade flow in the posterior circulation. The distal left vertebral artery is dominant. The tail of the distal right vertebral artery is degraded by motion but I suspect that vessel remains patent. Patent basilar artery. Normal PCA origins. Posterior communicating arteries are diminutive or absent. Bilateral PCA branches are within normal limits. Antegrade flow in both ICA siphons. The left siphon appears mildly dominant. Patent carotid termini. Normal left MCA and ACA origin while the right A1 is diminutive or absent. The right MCA origin appears normal. No siphon stenosis identified. Azygos type ACA A2 anatomy. MCA M1 segments are patent. Visible MCA and ACA branches are grossly normal. IMPRESSION: 1. Intermittently degraded by motion despite repeated imaging attempts. 2. No acute intracranial abnormality. Negative MRI appearance of the brain with dedicated IAC imaging. 3. Negative intracranial MRA allowing for motion artifact. Electronically Signed   By: Odessa Fleming M.D.   On: 07/09/2016 13:31    Scheduled Meds: . enoxaparin (LOVENOX) injection  40 mg Subcutaneous Q24H  . LORazepam  0.5 mg Oral TID  . meclizine  25 mg Oral TID  . sodium chloride flush  3 mL Intravenous Q12H   Continuous Infusions: . sodium chloride 50 mL/hr at 07/10/16  0334    Assessment/Plan:  1. Dizziness and headache and weakness. This is either a viral labyrinthitis versus debris in the semicircular canal. Change antevert to 3 times a day. Trial of Ativan 3 times a day. Continue IV fluid hydration. Neurology workup including MRI and MRA of the brain were negative. EEG was negative for seizure. No indication for lumbar puncture. 2. Chest pain. Seems to have stopped. Echo, stress test all normal. 3. History of von Willebrand's disease 4. Leukocytosis secondary to vomiting and steroids. Repeat normal range. 5. Diarrhea stool for C. difficile negative. Other stool studies still pending.  Code Status:     Code Status Orders        Start     Ordered   07/08/16 0810  Full code  Continuous     07/08/16 0809    Code Status History    Date Active Date Inactive Code Status Order ID Comments User Context   09/28/2012  4:23 PM 10/01/2012  2:29 PM Full Code [id number]  Mearl Latin, PA-C Inpatient     Family Communication: Wife at the bedside Disposition Plan: Continue to to observe  Consultants:  Neurology  Cardiology  Time spent: 45 minutes  Alford Highland  [employer]

## 2016-07-10 NOTE — Progress Notes (Signed)
Subjective: Patient reports developing diarrhea overnight.  Had multiple episodes of poor responsiveness on yesterday. Does not remember having many of the tests that he had performed yesterday.    Objective: Current vital signs: BP (!) 140/98 (BP Location: Left Arm)   Pulse 77   Temp 97.7 F (36.5 C)   Resp 14   Ht 6' (1.829 m)   Wt 230 lb 4.8 oz (104.5 kg)   SpO2 97%   BMI 31.23 kg/m  Vital signs in last 24 hours: Temp:  [97.6 F (36.4 C)-97.8 F (36.6 C)] 97.7 F (36.5 C) (03/08 0747) Pulse Rate:  [60-77] 77 (03/08 0747) Resp:  [14-16] 14 (03/08 0531) BP: (133-149)/(83-98) 140/98 (03/08 0747) SpO2:  [97 %-99 %] 97 % (03/08 0747) Weight:  [230 lb 4.8 oz (104.5 kg)] 230 lb 4.8 oz (104.5 kg) (03/08 0531)  Intake/Output from previous day: 03/07 0701 - 03/08 0700 In: 1040 [P.O.:240; I.V.:800] Out: 1051 [Urine:1050; Stool:1] Intake/Output this shift: Total I/O In: 240 [P.O.:240] Out: 1000 [Urine:1000] Nutritional status: Diet regular Room service appropriate? Yes; Fluid consistency: Thin  Neurologic Exam: Mental Status: Alert, oriented, thought content appropriate.  Speech fluent without evidence of aphasia.  Able to follow 3 step commands without difficulty. Cranial Nerves: II: Discs flat bilaterally; Visual fields grossly normal, pupils equal, round, reactive to light and accommodation III,IV, VI: ptosis not present, extra-ocular motions intact bilaterally V,VII: smile symmetric, facial light touch sensation normal bilaterally VIII: hearing normal bilaterally IX,X: gag reflex present XI: bilateral shoulder shrug XII: midline tongue extension Motor: Right : Upper extremity   5/5    Left:     Upper extremity   5/5  Lower extremity   5/5     Lower extremity   5/5    Lab Results: Basic Metabolic Panel:  Recent Labs Lab 07/08/16 0046 07/09/16 0632 07/10/16 0845  NA 143 139 137  K 3.8 3.9 3.5  CL 107 107 106  CO2 23 24 26   GLUCOSE 130* 161* 99  BUN 23* 18 11   CREATININE 1.07 0.69 0.73  CALCIUM 9.2 8.5* 8.0*    Liver Function Tests:  Recent Labs Lab 07/08/16 0413  AST 24  ALT 26  ALKPHOS 111  BILITOT 0.8  PROT 7.2  ALBUMIN 4.4    Recent Labs Lab 07/08/16 0413  LIPASE 30   No results for input(s): AMMONIA in the last 168 hours.  CBC:  Recent Labs Lab 07/08/16 0046 07/10/16 0845  WBC 17.0* 8.8  HGB 15.0 13.4  HCT 42.8 38.6*  MCV 94.3 95.1  PLT 249 216    Cardiac Enzymes:  Recent Labs Lab 07/08/16 0046 07/08/16 0413 07/08/16 1022 07/08/16 1626  TROPONINI <0.03 <0.03 <0.03 <0.03    Lipid Panel:  Recent Labs Lab 07/09/16 0632  CHOL 175  TRIG 207*  HDL 27*  CHOLHDL 6.5  VLDL 41*  LDLCALC 107*    CBG: No results for input(s): GLUCAP in the last 168 hours.  Microbiology: Results for orders placed or performed during the hospital encounter of 07/08/16  C difficile quick scan w PCR reflex     Status: None   Collection Time: 07/10/16  8:13 AM  Result Value Ref Range Status   C Diff antigen NEGATIVE NEGATIVE Final   C Diff toxin NEGATIVE NEGATIVE Final   C Diff interpretation No C. difficile detected.  Final    Coagulation Studies: No results for input(s): LABPROT, INR in the last 72 hours.  Imaging: Mr [facility] Contrast  Result Date: 07/09/2016 CLINICAL DATA:  36 year old male with acute onset headache nausea and left shoulder pain on the evening of presentation. Persistent headache and dizziness. Initial encounter. EXAM: MRI HEAD WITHOUT AND WITH CONTRAST MRA HEAD WITHOUT CONTRAST TECHNIQUE: Multiplanar, multiecho pulse sequences of the brain and surrounding structures were obtained without and with intravenous contrast. Angiographic images of the head were obtained using MRA technique without contrast. CONTRAST:  20mL MULTIHANCE GADOBENATE DIMEGLUMINE 529 MG/ML IV SOLN [facility]  [facility] FINDINGS: MRI HEAD FINDINGS Study is intermittently degraded by  motion artifact despite repeated imaging attempts. Brain: Cerebral volume is within normal limits. No restricted diffusion to suggest acute infarction. No midline shift, mass effect, evidence of mass lesion, ventriculomegaly, extra-axial collection or acute intracranial hemorrhage. Cervicomedullary junction and pituitary are within normal limits. Wallace CullensGray and white matter signal is within normal limits throughout the brain. No encephalomalacia or chronic cerebral blood products identified. No abnormal enhancement identified. No dural thickening. Vascular: Major intracranial vascular flow voids are preserved, the distal left vertebral artery appears dominant. Dural venous sinuses appear to be normally enhancing. Skull and upper cervical spine: Grossly negative. Sinuses/Orbits: Orbits soft tissues appear normal. Small right maxillary sinus mucous retention cyst. Otherwise Visualized paranasal sinuses are well pneumatized. Negative scalp soft tissues. Other: Dedicated internal auditory canal imaging. The cerebellopontine angles appear normal. Normal bilateral cisternal and intracanalicular 7th and 8th cranial nerve segments. Preserved T2 signal in the bilateral cochlea and vestibular structures. Mastoids are clear. Normal stylomastoid foramina. No abnormal enhancement identified. Parotid glands appear normal. MRA HEAD FINDINGS Antegrade flow in the posterior circulation. The distal left vertebral artery is dominant. The tail of the distal right vertebral artery is degraded by motion but I suspect that vessel remains patent. Patent basilar artery. Normal PCA origins. Posterior communicating arteries are diminutive or absent. Bilateral PCA branches are within normal limits. Antegrade flow in both ICA siphons. The left siphon appears mildly dominant. Patent carotid termini. Normal left MCA and ACA origin while the right A1 is diminutive or absent. The right MCA origin appears normal. No siphon stenosis identified. Azygos type  ACA A2 anatomy. MCA M1 segments are patent. Visible MCA and ACA branches are grossly normal. IMPRESSION: 1. Intermittently degraded by motion despite repeated imaging attempts. 2. No acute intracranial abnormality. Negative MRI appearance of the brain with dedicated IAC imaging. 3. Negative intracranial MRA allowing for motion artifact. Electronically Signed   By: Odessa FlemingH  Hall M.D.   On: 07/09/2016 13:31   Mr Laqueta JeanBrain W ZOWo Contrast  Result Date: 07/09/2016 CLINICAL DATA:  36 year old male with acute onset headache nausea and left shoulder pain on the evening of presentation. Persistent headache and dizziness. Initial encounter. EXAM: MRI HEAD WITHOUT AND WITH CONTRAST MRA HEAD WITHOUT CONTRAST TECHNIQUE: Multiplanar, multiecho pulse sequences of the brain and surrounding structures were obtained without and with intravenous contrast. Angiographic images of the head were obtained using MRA technique without contrast. CONTRAST:  20mL MULTIHANCE GADOBENATE DIMEGLUMINE 529 MG/ML IV SOLN [facility] FINDINGS: MRI HEAD FINDINGS Study is intermittently degraded by motion artifact despite repeated imaging attempts. Brain: Cerebral volume is within normal limits. No restricted diffusion to suggest acute infarction. No midline shift, mass effect, evidence of mass lesion, ventriculomegaly, extra-axial collection or acute intracranial hemorrhage. Cervicomedullary junction and pituitary are within normal limits. Wallace CullensGray and white matter signal is within normal limits throughout the brain. No encephalomalacia or chronic cerebral blood products identified. No abnormal  enhancement identified. No dural thickening. Vascular: Major intracranial vascular flow voids are preserved, the distal left vertebral artery appears dominant. Dural venous sinuses appear to be normally enhancing. Skull and upper cervical spine: Grossly negative. Sinuses/Orbits: Orbits soft tissues appear normal. Small  right maxillary sinus mucous retention cyst. Otherwise Visualized paranasal sinuses are well pneumatized. Negative scalp soft tissues. Other: Dedicated internal auditory canal imaging. The cerebellopontine angles appear normal. Normal bilateral cisternal and intracanalicular 7th and 8th cranial nerve segments. Preserved T2 signal in the bilateral cochlea and vestibular structures. Mastoids are clear. Normal stylomastoid foramina. No abnormal enhancement identified. Parotid glands appear normal. MRA HEAD FINDINGS Antegrade flow in the posterior circulation. The distal left vertebral artery is dominant. The tail of the distal right vertebral artery is degraded by motion but I suspect that vessel remains patent. Patent basilar artery. Normal PCA origins. Posterior communicating arteries are diminutive or absent. Bilateral PCA branches are within normal limits. Antegrade flow in both ICA siphons. The left siphon appears mildly dominant. Patent carotid termini. Normal left MCA and ACA origin while the right A1 is diminutive or absent. The right MCA origin appears normal. No siphon stenosis identified. Azygos type ACA A2 anatomy. MCA M1 segments are patent. Visible MCA and ACA branches are grossly normal. IMPRESSION: 1. Intermittently degraded by motion despite repeated imaging attempts. 2. No acute intracranial abnormality. Negative MRI appearance of the brain with dedicated IAC imaging. 3. Negative intracranial MRA allowing for motion artifact. Electronically Signed   By: Odessa Fleming M.D.   On: 07/09/2016 13:31   Nm Myocar Multi W/spect W/wall Motion / Ef  Result Date: 07/08/2016  Low risk study without evidence of ischemia or scar.  The left ventricular ejection fraction is normal (55-65%).     Medications:  I have reviewed the patient's current medications. Scheduled: . enoxaparin (LOVENOX) injection  40 mg Subcutaneous Q24H  . sodium chloride flush  3 mL Intravenous Q12H    Assessment/Plan: MRI of the  brain reviewed and shows no acute changes.  EEG captured multiple events but showed no epileptiform activity.  Patient did cycle in and out of stage II sleep frequently.  Unclear significance.  Wellbutrin, Flexeril and Lyrica discontinued.  Was noted to be orthostatic yesterday.  Recommendations: 1. Sleep study as an outpatient   LOS: 0 days   Thana Farr, MD Neurology [phone] 07/10/2016  11:03 AM

## 2016-07-11 LAB — THYROID PANEL WITH TSH
Free Thyroxine Index: 2 (ref 1.2–4.9)
T3 Uptake Ratio: 31 % (ref 24–39)
T4, Total: 6.3 ug/dL (ref 4.5–12.0)
TSH: 6.03 u[IU]/mL — AB (ref 0.450–4.500)

## 2016-07-11 LAB — ROCKY MTN SPOTTED FVR ABS PNL(IGG+IGM)
RMSF IGG: NEGATIVE
RMSF IgM: 0.31 index (ref 0.00–0.89)

## 2016-07-11 MED ORDER — LORAZEPAM 0.5 MG PO TABS: ORAL_TABLET | ORAL | 0 refills | Status: AC

## 2016-07-11 MED ORDER — MECLIZINE HCL 25 MG PO TABS: 25.0000 mg | ORAL_TABLET | Freq: Three times a day (TID) | ORAL | 0 refills | Status: AC

## 2016-07-11 NOTE — Discharge Summary (Signed)
[employer] [facility] [place] [facility]   PATIENT NAME: Ryan Santiago    MR#:  [id number]  DATE OF BIRTH:  Sep 08, 1980  DATE OF ADMISSION:  07/08/2016 ADMITTING PHYSICIAN: Ihor AustinPavan Pyreddy, MD  DATE OF DISCHARGE: 07/11/2016 12:27 PM  PRIMARY CARE PHYSICIAN: [facility]   ADMISSION DIAGNOSIS:  Chest pain [R07.9] Chest pain, unspecified type [R07.9]  DISCHARGE DIAGNOSIS:  Active Problems:   Chest pain   SECONDARY DIAGNOSIS:   Past Medical History:  Diagnosis Date  . Closed left ankle fracture    a. 08/2012: Fall from ladder->Left pilon dislocation and poss compartment syndrome->CRIF, closed manipulation/reduction of pilon/tib/fib, ext fixator, fasciotomies;  b. 09/2012 ORIF dist tib/fib.  . Sinus tachycardia   . Tobacco abuse   . Von Willebrand disease (HCC)     HOSPITAL COURSE:   1. Dizziness, headache and weakness. This is likely a viral labyrinthitis versus debris in the semicircular canal. The patient was given antevert and Ativan during the hospital course. Patient was given IV fluids during the entire hospital course. MRI of the brain was negative. MRA of the brain was negative. EEG was negative for seizure and he had a couple of these episodes while on the EEG. There was no indication for lumbar puncture. The patient took a long time to improve. 2. Chest pain. This has improved. Stress test was negative. Cardiac enzymes were negative. Echocardiogram within normal limits. Unclear if this was panic attack but seemed to improve with the Ativan. 3. History of von Willebrand's disease 4. Leukocytosis secondary to vomiting. Repeat came in the normal range 5. Diarrhea stool studies negative.  Recommend an outpatient sleep study  DISCHARGE CONDITIONS:   Satisfactory  CONSULTS OBTAINED:  Treatment Team:  Kym GroomNeuro1 [facility], MD Thana FarrLeslie Reynolds, MD  DRUG ALLERGIES:   Allergies  Allergen Reactions  . Morphine And Related Anaphylaxis    Arm became swollen,  ultimately intubated  . Dilaudid [Hydromorphone Hcl]     Hallucinations and disorientation  . Penicillins Hives    DISCHARGE MEDICATIONS:   Discharge Medication List as of 07/11/2016 11:14 AM    START taking these medications   Details  LORazepam (ATIVAN) 0.5 MG tablet One tab po tid for two days,then one tab po twice a day for two days; then one tab daily for three days, Print    meclizine (ANTIVERT) 25 MG tablet Take 1 tablet (25 mg total) by mouth 3 (three) times daily., Starting Fri 07/11/2016, Print      CONTINUE these medications which have NOT CHANGED   Details  ibuprofen (ADVIL,MOTRIN) 200 MG tablet Take 200 mg by mouth every 6 (six) hours as needed., Historical Med      STOP taking these medications     buPROPion (WELLBUTRIN SR) 150 MG 12 hr tablet      cyclobenzaprine (FLEXERIL) 10 MG tablet      docusate sodium 100 MG CAPS      enoxaparin (LOVENOX) 40 MG/0.4ML injection      oxyCODONE (OXY IR/ROXICODONE) 5 MG immediate release tablet      oxyCODONE-acetaminophen (PERCOCET) 10-325 MG per tablet      pregabalin (LYRICA) 75 MG capsule          DISCHARGE INSTRUCTIONS:   [facility]  If you experience worsening of your admission symptoms, develop shortness of breath, life threatening emergency, suicidal or homicidal thoughts you must seek medical attention immediately by calling 911 or calling your MD immediately  if symptoms less severe.  You Must read complete instructions/literature along with all the possible adverse reactions/side effects for all the Medicines you take and that have been prescribed to you. Take any new Medicines after you have completely understood and accept all the possible adverse reactions/side effects.   Please note  You were cared for by a hospitalist during your hospital stay. If you have any questions about your discharge medications or the care you received while you were in the hospital after you are  discharged, you can call the unit and asked to speak with the hospitalist on call if the hospitalist that took care of you is not available. Once you are discharged, your primary care physician will handle any further medical issues. Please note that NO REFILLS for any discharge medications will be authorized once you are discharged, as it is imperative that you return to your primary care physician (or establish a relationship with a primary care physician if you do not have one) for your aftercare needs so that they can reassess your need for medications and monitor your lab values.    Today   CHIEF COMPLAINT:   Chief Complaint  Patient presents with  . Chest Pain    HISTORY OF PRESENT ILLNESS:  Ryan Santiago  is a 36 y.o. male presented with chest pain, headache and dizziness   VITAL SIGNS:  Blood pressure 134/89, pulse (!) 59, temperature 98.2 F (36.8 C), temperature source Oral, resp. rate 18, height 6' (1.829 m), weight 104.5 kg (230 lb 4.8 oz), SpO2 95 %.    PHYSICAL EXAMINATION:  GENERAL:  36 y.o.-year-old patient lying in the bed with no acute distress.  EYES: Pupils equal, round, reactive to light and accommodation. No scleral icterus. Extraocular muscles intact.  HEENT: Head atraumatic, normocephalic. Oropharynx and nasopharynx clear.  NECK:  Supple, no jugular venous distention. No thyroid enlargement, no tenderness.  LUNGS: Normal breath sounds bilaterally, no wheezing, rales,rhonchi or crepitation. No use of accessory muscles of respiration.  CARDIOVASCULAR: S1, S2 normal. No murmurs, rubs, or gallops.  ABDOMEN: Soft, non-tender, non-distended. Bowel sounds present. No organomegaly or mass.  EXTREMITIES: No pedal edema, cyanosis, or clubbing.  NEUROLOGIC: Cranial nerves II through XII are intact. Muscle strength 5/5 in all extremities. Sensation intact. Gait not checked.  PSYCHIATRIC: The patient is alert and oriented x 3.  SKIN: No obvious rash, lesion, or ulcer.    DATA REVIEW:   CBC  Recent Labs Lab 07/10/16 0845  WBC 8.8  HGB 13.4  HCT 38.6*  PLT 216    Chemistries   Recent Labs Lab 07/08/16 0413  07/10/16 0845  NA  --   < > 137  K  --   < > 3.5  CL  --   < > 106  CO2  --   < > 26  GLUCOSE  --   < > 99  BUN  --   < > 11  CREATININE  --   < > 0.73  CALCIUM  --   < > 8.0*  AST 24  --   --   ALT 26  --   --   ALKPHOS 111  --   --   BILITOT 0.8  --   --   < > = values in this interval not displayed.  Cardiac Enzymes  Recent Labs Lab 07/08/16 1626  TROPONINI <0.03    Microbiology Results  Results for orders placed or performed during the hospital encounter of 07/08/16  C difficile quick scan w  PCR reflex     Status: None   Collection Time: 07/10/16  8:13 AM  Result Value Ref Range Status   C Diff antigen NEGATIVE NEGATIVE Final   C Diff toxin NEGATIVE NEGATIVE Final   C Diff interpretation No C. difficile detected.  Final     Management plans discussed with the patient, and he is in agreement.  CODE STATUS:  Code Status History    Date Active Date Inactive Code Status Order ID Comments User Context   07/08/2016  8:09 AM 07/11/2016  3:27 PM Full Code [id number]  Ihor Austin, MD Inpatient   09/28/2012  4:23 PM 10/01/2012  2:29 PM Full Code [id number]  Mearl Latin, PA-C Inpatient      TOTAL TIME TAKING CARE OF THIS PATIENT: 32 minutes.    Alford Highland M.D on 07/11/2016 at 3:44 PM  Between 7am to 6pm - Pager - [phone]  After 6pm go to [email or web address] - password [email or web address] [facility]  [employer] [facility] Office  [phone]  CC: Primary care physician; Open or clinic

## 2016-07-11 NOTE — Discharge Instructions (Signed)
Lorazepam tablets What is this medicine? LORAZEPAM (lor A ze pam) is a benzodiazepine. It is used to treat anxiety. This medicine may be used for other purposes; ask your health care provider or pharmacist if you have questions. COMMON BRAND NAME(S): Ativan What should I tell my health care provider before I take this medicine? They need to know if you have any of these conditions: -glaucoma -history of drug or alcohol abuse problem -kidney disease -liver disease -lung or breathing disease, like asthma -mental illness -myasthenia gravis -Parkinson's disease -suicidal thoughts, plans, or attempt; a previous suicide attempt by you or a family member -an unusual or allergic reaction to lorazepam, other medicines, foods, dyes, or preservatives -pregnant or trying to get pregnant -breast-feeding How should I use this medicine? Take this medicine by mouth with a glass of water. Follow the directions on the prescription label. Take your medicine at regular intervals. Do not take it more often than directed. Do not stop taking except on your doctor's advice. A special MedGuide will be given to you by the pharmacist with each prescription and refill. Be sure to read this information carefully each time. Talk to your pediatrician regarding the use of this medicine in children. While this drug may be used in children as young as 12 years for selected conditions, precautions do apply. Overdosage: If you think you have taken too much of this medicine contact a poison control center or emergency room at once. NOTE: This medicine is only for you. Do not share this medicine with others. What if I miss a dose? If you miss a dose, take it as soon as you can. If it is almost time for your next dose, take only that dose. Do not take double or extra doses. What may interact with this medicine? Do not take this medicine with any of the following medications: -narcotic medicines for cough -sodium  oxybate This medicine may also interact with the following medications: -alcohol -antihistamines for allergy, cough and cold -certain medicines for anxiety or sleep -certain medicines for depression, like amitriptyline, fluoxetine, sertraline -certain medicines for seizures like carbamazepine, phenobarbital, phenytoin, primidone -general anesthetics like lidocaine, pramoxine, tetracaine -MAOIs like Carbex, Eldepryl, Marplan, Nardil, and Parnate -medicines that relax muscles for surgery -narcotic medicines for pain -phenothiazines like chlorpromazine, mesoridazine, prochlorperazine, thioridazine This list may not describe all possible interactions. Give your health care provider a list of all the medicines, herbs, non-prescription drugs, or dietary supplements you use. Also tell them if you smoke, drink alcohol, or use illegal drugs. Some items may interact with your medicine. What should I watch for while using this medicine? Tell your doctor or health care professional if your symptoms do not start to get better or if they get worse. Do not stop taking except on your doctor's advice. You may develop a severe reaction. Your doctor will tell you how much medicine to take. You may get drowsy or dizzy. Do not drive, use machinery, or do anything that needs mental alertness until you know how this medicine affects you. To reduce the risk of dizzy and fainting spells, do not stand or sit up quickly, especially if you are an older patient. Alcohol may increase dizziness and drowsiness. Avoid alcoholic drinks. If you are taking another medicine that also causes drowsiness, you may have more side effects. Give your health care provider a list of all medicines you use. Your doctor will tell you how much medicine to take. Do not take more medicine than directed. Call   emergency for help if you have problems breathing or unusual sleepiness. What side effects may I notice from receiving this medicine? Side  effects that you should report to your doctor or health care professional as soon as possible: -allergic reactions like skin rash, itching or hives, swelling of the face, lips, or tongue -breathing problems -confusion -loss of balance or coordination -signs and symptoms of low blood pressure like dizziness; feeling faint or lightheaded, falls; unusually weak or tired -suicidal thoughts or other mood changes Side effects that usually do not require medical attention (report to your doctor or health care professional if they continue or are bothersome): -dizziness -headache -nausea, vomiting -tiredness This list may not describe all possible side effects. Call your doctor for medical advice about side effects. You may report side effects to FDA at 1-800-FDA-1088. Where should I keep my medicine? Keep out of the reach of children. This medicine can be abused. Keep your medicine in a safe place to protect it from theft. Do not share this medicine with anyone. Selling or giving away this medicine is dangerous and against the law. This medicine may cause accidental overdose and death if taken by other adults, children, or pets. Mix any unused medicine with a substance like cat litter or coffee grounds. Then throw the medicine away in a sealed container like a sealed bag or a coffee can with a lid. Do not use the medicine after the expiration date. Store at room temperature between 20 and 25 degrees C (68 and 77 degrees F). Protect from light. Keep container tightly closed. NOTE: This sheet is a summary. It may not cover all possible information. If you have questions about this medicine, talk to your doctor, pharmacist, or health care provider.  2018 Elsevier/Gold Standard (2015-01-18 15:54:27) Meclizine tablets or capsules What is this medicine? MECLIZINE (MEK li zeen) is an antihistamine. It is used to prevent nausea, vomiting, or dizziness caused by motion sickness. It is also used to prevent and  treat vertigo (extreme dizziness or a feeling that you or your surroundings are tilting or spinning around). This medicine may be used for other purposes; ask your health care provider or pharmacist if you have questions. COMMON BRAND NAME(S): Antivert, Dramamine Less Drowsy, Medivert, Meni-D What should I tell my health care provider before I take this medicine? They need to know if you have any of these conditions: -glaucoma -lung or breathing disease, like asthma -problems urinating -prostate disease -stomach or intestine problems -an unusual or allergic reaction to meclizine, other medicines, foods, dyes, or preservatives -pregnant or trying to get pregnant -breast-feeding How should I use this medicine? Take this medicine by mouth with a glass of water. Follow the directions on the prescription label. If you are using this medicine to prevent motion sickness, take the dose at least 1 hour before travel. If it upsets your stomach, take it with food or milk. Take your doses at regular intervals. Do not take your medicine more often than directed. Talk to your pediatrician regarding the use of this medicine in children. Special care may be needed. Overdosage: If you think you have taken too much of this medicine contact a poison control center or emergency room at once. NOTE: This medicine is only for you. Do not share this medicine with others. What if I miss a dose? If you miss a dose, take it as soon as you can. If it is almost time for your next dose, take only that dose. Do not take  double or extra doses. What may interact with this medicine? Do not take this medicine with any of the following medications: -MAOIs like Carbex, Eldepryl, Marplan, Nardil, and Parnate This medicine may also interact with the following medications: -alcohol -antihistamines for allergy, cough and cold -certain medicines for anxiety or sleep -certain medicines for depression, like amitriptyline,  fluoxetine, sertraline -certain medicines for seizures like phenobarbital, primidone -general anesthetics like halothane, isoflurane, methoxyflurane, propofol -local anesthetics like lidocaine, pramoxine, tetracaine -medicines that relax muscles for surgery -narcotic medicines for pain -phenothiazines like chlorpromazine, mesoridazine, prochlorperazine, thioridazine This list may not describe all possible interactions. Give your health care provider a list of all the medicines, herbs, non-prescription drugs, or dietary supplements you use. Also tell them if you smoke, drink alcohol, or use illegal drugs. Some items may interact with your medicine. What should I watch for while using this medicine? Tell your doctor or healthcare professional if your symptoms do not start to get better or if they get worse. You may get drowsy or dizzy. Do not drive, use machinery, or do anything that needs mental alertness until you know how this medicine affects you. Do not stand or sit up quickly, especially if you are an older patient. This reduces the risk of dizzy or fainting spells. Alcohol may interfere with the effect of this medicine. Avoid alcoholic drinks. Your mouth may get dry. Chewing sugarless gum or sucking hard candy, and drinking plenty of water may help. Contact your doctor if the problem does not go away or is severe. This medicine may cause dry eyes and blurred vision. If you wear contact lenses you may feel some discomfort. Lubricating drops may help. See your eye doctor if the problem does not go away or is severe. What side effects may I notice from receiving this medicine? Side effects that you should report to your doctor or health care professional as soon as possible: -feeling faint or lightheaded, falls -fast, irregular heartbeat Side effects that usually do not require medical attention (report to your doctor or health care professional if they continue or are  bothersome): -constipation -headache -trouble passing urine or change in the amount of urine -trouble sleeping -upset stomach This list may not describe all possible side effects. Call your doctor for medical advice about side effects. You may report side effects to FDA at 1-800-FDA-1088. Where should I keep my medicine? Keep out of the reach of children. Store at room temperature between 15 and 30 degrees C (59 and 86 degrees F). Keep container tightly closed. Throw away any unused medicine after the expiration date. NOTE: This sheet is a summary. It may not cover all possible information. If you have questions about this medicine, talk to your doctor, pharmacist, or health care provider.  2018 Elsevier/Gold Standard (2015-05-23 19:41:02) Near-Syncope Near-syncope is when you suddenly get weak or dizzy, or you feel like you might pass out (faint). During an episode of near-syncope, you may:  Feel dizzy or light-headed.  Feel sick to your stomach (nauseous).  See all white or all black.  Have cold, clammy skin. If you passed out, get help right away.Call your local emergency services (911 in the [place] Do not drive yourself to the hospital. Follow these instructions at home: Pay attention to any changes in your symptoms. Take these actions to help with your condition:  Have someone stay with you until you feel stable.  Do not drive, use machinery, or play sports until your doctor says it is okay.  Keep all follow-up visits as told by your doctor. This is important.  If you start to feel like you might pass out, lie down right away and raise (elevate) your feet above the level of your heart. Breathe deeply and steadily. Wait until all of the symptoms are gone.  Drink enough fluid to keep your pee (urine) clear or pale yellow.  If you are taking blood pressure or heart medicine, get up slowly and spend many minutes getting ready to sit and then stand. This can help with  dizziness.  Take over-the-counter and prescription medicines only as told by your doctor. Get help right away if:  You have a very bad headache.  You have unusual pain in your chest, tummy, or back.  You are bleeding from your mouth or rectum.  You have black or tarry poop (stool).  You have a very fast or uneven heartbeat (palpitations).  You pass out one time or more than once.  You have jerky movements that you cannot control (seizure).  You are confused.  You have trouble walking.  You are very weak.  You have vision problems. These symptoms may be an emergency. Do not wait to see if the symptoms will go away. Get medical help right away. Call your local emergency services (911 in the [place] Do not drive yourself to the hospital. This information is not intended to replace advice given to you by your health care provider. Make sure you discuss any questions you have with your health care provider. Document Released: 10/08/2007 Document Revised: 09/27/2015 Document Reviewed: 01/03/2015 Elsevier Interactive Patient Education  2017 [employer]

## 2016-07-11 NOTE — Progress Notes (Signed)
Patient refuses bed alarm. 

## 2016-07-11 NOTE — Progress Notes (Signed)
Patient ID: Nona DellGeorge C Tutor III, male   DOB: [birth year], 36 y.o.   MRN: [id number] [facility]        Coral SpikesGeorge Pua was admitted to the Hospital on 07/08/2016 and Discharged  07/11/2016 and should be excused from work/school   for 10 days starting 07/08/2016 , may return to work/school without any restrictions.  Alford HighlandWIETING, Tiena Manansala M.D on 07/11/2016,at 10:07 AM  [employer] [facility] [facility] [place] [facility]    Office  [phone]

## 2016-07-11 NOTE — Progress Notes (Signed)
Patient discharged via wheelchair and private vehicle. IV removed and catheter intact. All discharge instructions given and patient verbalizes understanding. Tele removed and returned. No prescriptions given to patient No distress noted.   

## 2016-07-16 LAB — GASTROINTESTINAL PANEL BY PCR, STOOL (REPLACES STOOL CULTURE)
ASTROVIRUS: NOT DETECTED
Adenovirus F40/41: NOT DETECTED
CAMPYLOBACTER SPECIES: NOT DETECTED
CYCLOSPORA CAYETANENSIS: NOT DETECTED
Cryptosporidium: NOT DETECTED
ENTEROTOXIGENIC E COLI (ETEC): NOT DETECTED
Entamoeba histolytica: NOT DETECTED
Enteroaggregative E coli (EAEC): NOT DETECTED
Enteropathogenic E coli (EPEC): NOT DETECTED
Giardia lamblia: NOT DETECTED
NOROVIRUS GI/GII: DETECTED — AB
PLESIMONAS SHIGELLOIDES: NOT DETECTED
Rotavirus A: NOT DETECTED
SAPOVIRUS (I, II, IV, AND V): NOT DETECTED
SHIGA LIKE TOXIN PRODUCING E COLI (STEC): NOT DETECTED
Salmonella species: NOT DETECTED
Shigella/Enteroinvasive E coli (EIEC): NOT DETECTED
VIBRIO SPECIES: NOT DETECTED
Vibrio cholerae: NOT DETECTED
Yersinia enterocolitica: NOT DETECTED

## 2017-10-19 ENCOUNTER — Emergency Department: Payer: Self-pay

## 2017-10-19 ENCOUNTER — Emergency Department
Admission: EM | Admit: 2017-10-19 | Discharge: 2017-10-19 | Disposition: A | Discharge status: Home / Self Care | Payer: Self-pay | Attending: Emergency Medicine | Admitting: Emergency Medicine

## 2017-10-19 ENCOUNTER — Encounter: Payer: Self-pay | Admitting: Emergency Medicine

## 2017-10-19 DIAGNOSIS — Z79899 Other long term (current) drug therapy: Secondary | ICD-10-CM | POA: Insufficient documentation

## 2017-10-19 DIAGNOSIS — Z87891 Personal history of nicotine dependence: Secondary | ICD-10-CM | POA: Insufficient documentation

## 2017-10-19 DIAGNOSIS — M722 Plantar fascial fibromatosis: Secondary | ICD-10-CM | POA: Insufficient documentation

## 2017-10-19 DIAGNOSIS — D680 Von Willebrand's disease: Secondary | ICD-10-CM | POA: Insufficient documentation

## 2017-10-19 MED ORDER — TRAMADOL HCL 50 MG PO TABS: 50.0000 mg | ORAL_TABLET | Freq: Four times a day (QID) | ORAL | 0 refills | Status: AC | PRN

## 2017-10-19 MED ORDER — MELOXICAM 15 MG PO TABS: 15.0000 mg | ORAL_TABLET | Freq: Every day | ORAL | 0 refills | Status: AC

## 2017-10-19 MED ORDER — KETOROLAC TROMETHAMINE 30 MG/ML IJ SOLN
30.0000 mg | Freq: Once | INTRAMUSCULAR | Status: AC
  Administered 2017-10-19: 30 mg via INTRAMUSCULAR
  Filled 2017-10-19: qty 1

## 2017-10-19 NOTE — ED Provider Notes (Signed)
[facility] Emergency Department Provider Note   ____________________________________________   First MD Initiated Contact with Patient 10/19/17 [phone]     (approximate)  I have reviewed the triage vital signs and the nursing notes.   HISTORY  Chief Complaint Foot Pain   HPI Ryan Santiago is a 37 y.o. male is here with complaint of right foot pain for several days with swelling.  Patient states that walking has become extremely painful especially first thing in the morning.  He denies any injury to his foot.  He has been taking ibuprofen frequently without any relief.  He denies any previous injury to his foot.  He rates his pain as an 8 out of 10.   Past Medical History:  Diagnosis Date  . Closed left ankle fracture    a. 08/2012: Fall from ladder->Left pilon dislocation and poss compartment syndrome->CRIF, closed manipulation/reduction of pilon/tib/fib, ext fixator, fasciotomies;  b. 09/2012 ORIF dist tib/fib.  . Sinus tachycardia   . Tobacco abuse   . Von Willebrand disease [facility](HCC)     Patient Active Problem List   Diagnosis Date Noted  . Chest pain 07/08/2016  . Atelectasis 10/01/2012  . Sinus tachycardia 09/30/2012  . Compartment syndrome of lower extremity, traumatic left leg 08/31/2012  . Fracture of metatarsal bone of left foot 08/31/2012  . Von Willebrand disease (HCC)   . Closed pilon fracture of left tibia 08/29/2012  . Fall 08/29/2012  . Nicotine dependence 08/29/2012    Past Surgical History:  Procedure Laterality Date  . APPLICATION OF WOUND VAC Left 08/28/2012   Procedure: APPLICATION OF WOUND VAC;  Surgeon: Budd PalmerMichael H Handy, MD;  Location: [facility] OR;  Service: Orthopedics;  Laterality: Left;; (out on 08/31/2012) (09/28/2012)  . CYSTIC HYGROMA EXCISION  2001   small intestinal removal; "benign" (09/28/2012)  . DORSAL COMPARTMENT RELEASE Left 08/28/2012   Procedure: RELEASE DORSAL COMPARTMENT (DEQUERVAIN);  Surgeon: Budd PalmerMichael H Handy, MD;   [facility];  Service: Orthopedics;  Laterality: Left;  . EXTERNAL FIXATION LEG Left 08/28/2012   Procedure: EXTERNAL FIXATION Left Tibia fuibula fracture;  Surgeon: Budd PalmerMichael H Handy, MD;  Location: [facility] OR;  Service: Orthopedics;  Laterality: Left;  . FASCIOTOMY Left 08/30/2012   Procedure: FASCIOTOMY CLOSURE LEFT LEG;  Surgeon: Budd PalmerMichael H Handy, MD;  [facility];  Service: Orthopedics;  Laterality: Left;  . KNEE ARTHROSCOPY Right 04/2009  . OPEN REDUCTION INTERNAL FIXATION (ORIF) TIBIA/FIBULA FRACTURE Left 09/28/2012   REMOVING EXTERNAL FIXATOR   . ORIF ANKLE FRACTURE Left 09/28/2012   Procedure: OPEN REDUCTION INTERNAL FIXATION (ORIF) DISTAL TIBIA FRACTURE, REMOVING EXTERNAL FIXATOR;  Surgeon: Budd PalmerMichael H Handy, MD;  Location: [facility] OR;  Service: Orthopedics;  Laterality: Left;    Prior to Admission medications   Medication Sig Start Date End Date Taking? Authorizing Provider  ibuprofen (ADVIL,MOTRIN) 200 MG tablet Take 200 mg by mouth every 6 (six) hours as needed.    [provider]  LORazepam (ATIVAN) 0.5 MG tablet One tab po tid for two days,then one tab po twice a day for two days; then one tab daily for three days 07/11/16   Alford HighlandWieting, Richard, MD  meclizine (ANTIVERT) 25 MG tablet Take 1 tablet (25 mg total) by mouth 3 (three) times daily. 07/11/16   Alford HighlandWieting, Richard, MD  meloxicam (MOBIC) 15 MG tablet Take 1 tablet (15 mg total) by mouth daily. 10/19/17 10/19/18  Tommi RumpsSummers, Haislee Corso L, PA-C  traMADol (ULTRAM) 50 MG tablet Take 1 tablet (50 mg total) by  mouth every 6 (six) hours as needed. 10/19/17   Tommi Rumps, PA-C    Allergies Morphine and related; Dilaudid [hydromorphone hcl]; and Penicillins  Family History  Problem Relation Age of Onset  . Ovarian cancer Mother        alive - early 28's  . Other Father        alive and well.    Social History Social History   Tobacco Use  . Smoking status: Former Smoker    Packs/day: 1.00    Years: 16.00    Pack years: 16.00     Types: E-cigarettes  . Smokeless tobacco: Never Used  Substance Use Topics  . Alcohol use: No  . Drug use: No    Review of Systems Constitutional: No fever/chills Cardiovascular: Denies chest pain. Respiratory: Denies shortness of breath. Musculoskeletal: Positive for right foot pain. Skin: Negative for rash. Neurological: Negative for headaches, focal weakness or numbness. ____________________________________________   PHYSICAL EXAM:  VITAL SIGNS: ED Triage Vitals  Enc Vitals Group     BP 10/19/17 0732 (!) 137/91     Pulse Rate 10/19/17 0732 77     Resp --      Temp 10/19/17 0732 98.7 F (37.1 C)     Temp Source 10/19/17 0732 Oral     SpO2 10/19/17 0732 96 %     Weight 10/19/17 0726 230 lb (104.3 kg)     Height 10/19/17 0726 6' (1.829 m)     Head Circumference --      Peak Flow --      Pain Score 10/19/17 0725 8     Pain Loc --      Pain Edu? --      Excl. in GC? --    Constitutional: Alert and oriented. Well appearing and in no acute distress. Eyes: Conjunctivae are normal.  Head: Atraumatic. Neck: No stridor.   Cardiovascular: Normal rate, regular rhythm. Grossly normal heart sounds.  Good peripheral circulation. Respiratory: Normal respiratory effort.  No retractions. Lungs CTAB. Musculoskeletal: On examination of the right foot there is no gross deformity and no injury noted.  Skin is intact.  No erythema or ecchymosis seen.  Patient is able to flex and extend at the ankle and also move digits distal to his pain.  Patient is tender to the medial arch area. Neurologic:  Normal speech and language. No gross focal neurologic deficits are appreciated.  Skin:  Skin is warm, dry and intact. No rash noted. Psychiatric: Mood and affect are normal. Speech and behavior are normal.  ____________________________________________   LABS (all labs ordered are listed, but only abnormal results are displayed)  Labs Reviewed - No data to display  RADIOLOGY  ED MD  interpretation:   Right foot x-ray has small plantar spur.  No acute changes noted.  Official radiology report(s): Dg Foot Complete Right  Result Date: 10/19/2017 CLINICAL DATA:  37 year old male with right foot pain for a few days now with swelling. Denies injury. Initial encounter. EXAM: RIGHT FOOT COMPLETE - 3+ VIEW COMPARISON:  12/11/2010 right ankle films. FINDINGS: No fracture or dislocation. Mild irregularity dorsal aspect of the talar neck unchanged from prior exam. Tiny plantar spur. Incidentally noted is a bipartite sesamoid. IMPRESSION: No acute bony abnormality noted. Tiny plantar spur. Electronically Signed   By: Lacy Duverney M.D.   On: 10/19/2017 08:30    ____________________________________________   PROCEDURES  Procedure(s) performed: None  Procedures  Critical Care performed: No  ____________________________________________   INITIAL IMPRESSION /  ASSESSMENT AND PLAN / ED COURSE  As part of my medical decision making, I reviewed the following data within the electronic MEDICAL RECORD NUMBER Notes from prior ED visits and [place] Controlled Substance Database  Patient is here with complaint of right foot pain for several days.  He currently is using crutches to ambulate with increased pain with weightbearing and walking.  Patient takes ibuprofen every 3-4 hours.  He is to discontinue taking this.  He was given Toradol 30 mg IM and discharged with a prescription for meloxicam 50 mg 1 daily with food and tramadol every 6 hours if needed for moderate pain #15. ____________________________________________   FINAL CLINICAL IMPRESSION(S) / ED DIAGNOSES  Final diagnoses:  Plantar fasciitis of right foot     ED Discharge Orders        Ordered    meloxicam (MOBIC) 15 MG tablet  Daily     10/19/17 0847    traMADol (ULTRAM) 50 MG tablet  Every 6 hours PRN     10/19/17 0847       Note:  This document was prepared using Dragon voice recognition software and may include  unintentional dictation errors.    Tommi Rumps, PA-C 10/19/17 0915    Pershing Proud Myra Rude, MD 10/19/17 [phone]

## 2017-10-19 NOTE — ED Notes (Signed)
See triage note  States he developed some pain to right foot on Thursday or Friday  States he was able to walk  Pain has increased over the weekend    States pain is mainly to bottom of foot  And moves across top of foot  No swelling or deformity noted

## 2017-10-19 NOTE — Discharge Instructions (Addendum)
Discontinue taking ibuprofen at this time.  Begin taking meloxicam 15 mg 1 daily with food.  Tramadol 1 every 6 hours as needed for moderate pain.  Do not drive or operate machinery while taking this medication.  Wear supportive shoes and if possible shoes with hard soles to prevent your foot from flexing when walking.  If not improving follow-up with Dr. Alberteen Spindleline in [facility]iat[facility] at [facility].

## 2017-10-19 NOTE — ED Triage Notes (Signed)
Pt reports pain to his right foot for a few days and now with swelling. Denies obvious injuries.

## 2020-02-24 ENCOUNTER — Emergency Department (INDEPENDENT_AMBULATORY_CARE_PROVIDER_SITE_OTHER): Payer: Self-pay

## 2020-02-24 ENCOUNTER — Emergency Department
Admission: RE | Admit: 2020-02-24 | Discharge: 2020-02-24 | Disposition: A | Discharge status: Home / Self Care | Payer: Self-pay | Source: Ambulatory Visit

## 2020-02-24 ENCOUNTER — Other Ambulatory Visit: Payer: Self-pay

## 2020-02-24 VITALS — BP 133/88 | HR 84 | Temp 99.3°F | Resp 18

## 2020-02-24 DIAGNOSIS — M25571 Pain in right ankle and joints of right foot: Secondary | ICD-10-CM

## 2020-02-24 DIAGNOSIS — M79671 Pain in right foot: Secondary | ICD-10-CM

## 2020-02-24 NOTE — ED Triage Notes (Signed)
Pt states he was walking upstairs on Tuesday and heard a pop in the top of his R foot. Reports ongoing pain. Pain radiates into his ankle

## 2020-02-24 NOTE — ED Provider Notes (Signed)
Ryan Santiago CARE    CSN: [id number] Arrival date & time: 02/24/20  0941      History   Chief Complaint Chief Complaint  Patient presents with  . Foot Pain    HPI LARWENCE TU Santiago is a 39 y.o. male.   HPI Ryan Santiago is a 39 y.o. male presenting to UC with c/o Right foot foot pain and swelling radiating into Right ankle since Tuesday night (3 days ago). Pt reports hearing and feeling a "pop" in his foot while walking upstairs. Denies rolling his foot. Pain has gradually worsened. Associated bruising. He has been taking 800mg  ibuprofen every 4 hours. Pain is 6/10. No prior fracture to same foot.   Past Medical History:  Diagnosis Date  . Closed left ankle fracture    a. 08/2012: Fall from ladder->Left pilon dislocation and poss compartment syndrome->CRIF, closed manipulation/reduction of pilon/tib/fib, ext fixator, fasciotomies;  b. 09/2012 ORIF dist tib/fib.  . Sinus tachycardia   . Tobacco abuse   . Von Willebrand disease [facility])     Patient Active Problem List   Diagnosis Date Noted  . Chest pain 07/08/2016  . Atelectasis 10/01/2012  . Sinus tachycardia 09/30/2012  . Compartment syndrome of lower extremity, traumatic left leg 08/31/2012  . Fracture of metatarsal bone of left foot 08/31/2012  . Von Willebrand disease (HCC)   . Closed pilon fracture of left tibia 08/29/2012  . Fall 08/29/2012  . Nicotine dependence 08/29/2012    Past Surgical History:  Procedure Laterality Date  . APPLICATION OF WOUND VAC Left 08/28/2012   Procedure: APPLICATION OF WOUND VAC;  Surgeon: 08/30/2012, MD;  Location: [facility] OR;  Service: Orthopedics;  Laterality: Left;; (out on 08/31/2012) (09/28/2012)  . CYSTIC HYGROMA EXCISION  2001   small intestinal removal; "benign" (09/28/2012)  . DORSAL COMPARTMENT RELEASE Left 08/28/2012   Procedure: RELEASE DORSAL COMPARTMENT (DEQUERVAIN);  Surgeon: 08/30/2012, MD;  Location: [facility] OR;  Service: Orthopedics;  Laterality: Left;  .  EXTERNAL FIXATION LEG Left 08/28/2012   Procedure: EXTERNAL FIXATION Left Tibia fuibula fracture;  Surgeon: 08/30/2012, MD;  Location: [facility] OR;  Service: Orthopedics;  Laterality: Left;  . FASCIOTOMY Left 08/30/2012   Procedure: FASCIOTOMY CLOSURE LEFT LEG;  Surgeon: 09/01/2012, MD;  Location: [facility] OR;  Service: Orthopedics;  Laterality: Left;  . KNEE ARTHROSCOPY Right 04/2009  . OPEN REDUCTION INTERNAL FIXATION (ORIF) TIBIA/FIBULA FRACTURE Left 09/28/2012   REMOVING EXTERNAL FIXATOR   . ORIF ANKLE FRACTURE Left 09/28/2012   Procedure: OPEN REDUCTION INTERNAL FIXATION (ORIF) DISTAL TIBIA FRACTURE, REMOVING EXTERNAL FIXATOR;  Surgeon: 09/30/2012, MD;  Location: [facility] OR;  Service: Orthopedics;  Laterality: Left;       Home Medications    Prior to Admission medications   Medication Sig Start Date End Date Taking? Authorizing Provider  ibuprofen (ADVIL,MOTRIN) 200 MG tablet Take 200 mg by mouth every 6 (six) hours as needed.    [provider]  LORazepam (ATIVAN) 0.5 MG tablet One tab po tid for two days,then one tab po twice a day for two days; then one tab daily for three days 07/11/16   09/10/16, MD  meclizine (ANTIVERT) 25 MG tablet Take 1 tablet (25 mg total) by mouth 3 (three) times daily. 07/11/16   09/10/16, MD  traMADol (ULTRAM) 50 MG tablet Take 1 tablet (50 mg total) by mouth every 6 (six) hours as needed. 10/19/17   10/21/17, PA-C  Family History Family History  Problem Relation Age of Onset  . Ovarian cancer Mother        alive - early 7's  . Cancer Mother   . Other Father        alive and well.    Social History Social History   Tobacco Use  . Smoking status: Former Smoker    Packs/day: 1.00    Years: 16.00    Pack years: 16.00    Types: E-cigarettes  . Smokeless tobacco: Never Used  Substance Use Topics  . Alcohol use: No  . Drug use: No     Allergies   Morphine and related, Dilaudid [hydromorphone hcl], and  Penicillins   Review of Systems Review of Systems  Musculoskeletal: Positive for arthralgias, gait problem and joint swelling.  Skin: Positive for color change. Negative for wound.     Physical Exam Triage Vital Signs ED Triage Vitals  Enc Vitals Group     BP 02/24/20 1004 133/88     Pulse Rate 02/24/20 1004 84     Resp 02/24/20 1004 18     Temp 02/24/20 1004 99.3 F (37.4 C)     Temp Source 02/24/20 1004 Oral     SpO2 02/24/20 1004 100 %     Weight --      Height --      Head Circumference --      Peak Flow --      Pain Score 02/24/20 1001 6     Pain Loc --      Pain Edu? --      Excl. in GC? --    No data found.  Updated Vital Signs BP 133/88 (BP Location: Left Arm)   Pulse 84   Temp 99.3 F (37.4 C) (Oral)   Resp 18   SpO2 100%   Visual Acuity Right Eye Distance:   Left Eye Distance:   Bilateral Distance:    Right Eye Near:   Left Eye Near:    Bilateral Near:     Physical Exam Vitals and nursing note reviewed.  Constitutional:      Appearance: He is well-developed.  HENT:     Head: Normocephalic and atraumatic.  Cardiovascular:     Rate and Rhythm: Normal rate and regular rhythm.     Pulses:          Dorsalis pedis pulses are 2+ on the right side.       Posterior tibial pulses are 2+ on the right side.  Pulmonary:     Effort: Pulmonary effort is normal.  Musculoskeletal:        General: Swelling and tenderness present. Normal range of motion.     Cervical back: Normal range of motion.       Feet:     Comments: Right foot: increased pain with dorsiflexion, improved pain with plantarflexion.  Calf is soft, non-tender.  Skin:    General: Skin is warm and dry.     Capillary Refill: Capillary refill takes less than 2 seconds.     Findings: Bruising present.  Neurological:     Mental Status: He is alert and oriented to person, place, and time.     Sensory: No sensory deficit.  Psychiatric:        Behavior: Behavior normal.      UC  Treatments / Results  Labs (all labs ordered are listed, but only abnormal results are displayed) Labs Reviewed - No data to display  EKG  Radiology DG Ankle Complete Right  Result Date: 02/24/2020 CLINICAL DATA:  Acute right ankle pain after walking stairs. EXAM: RIGHT ANKLE - COMPLETE 3+ VIEW COMPARISON:  None. FINDINGS: There is no evidence of fracture, dislocation, or joint effusion. There is no evidence of arthropathy or other focal bone abnormality. Soft tissues are unremarkable. IMPRESSION: Negative. Electronically Signed   By: Lupita Raider M.D.   On: 02/24/2020 10:40   DG Foot Complete Right  Result Date: 02/24/2020 CLINICAL DATA:  Acute right foot pain after walking up stairs. EXAM: RIGHT FOOT COMPLETE - 3+ VIEW COMPARISON:  None. FINDINGS: There is no evidence of fracture or dislocation. There is no evidence of arthropathy or other focal bone abnormality. Soft tissues are unremarkable. IMPRESSION: Negative. Electronically Signed   By: Lupita Raider M.D.   On: 02/24/2020 10:39    Procedures Procedures (including critical care time)  Medications Ordered in UC Medications - No data to display  Initial Impression / Assessment and Plan / UC Course  I have reviewed the triage vital signs and the nursing notes.  Pertinent labs & imaging results that were available during my care of the patient were reviewed by me and considered in my medical decision making (see chart for details).     Reviewed imaging with pt Question possible stress fracture given pain with ambulation  Pt has crutches at home. Offered walking boot for comfort, pt would like to try the boot Encouraged f/u with Sports Medicine in 1-2 weeks if not improving.  Final Clinical Impressions(s) / UC Diagnoses   Final diagnoses:  Right ankle pain  Foot pain, right     Discharge Instructions      Your imaging was normal today, however, if your pain continues, you may need further evaluation by Sports  Medicine next week.  You may wear the boot for comfort or use your crutches to help keep weight off your foot while you are having pain.  You may take 500mg  acetaminophen every 4-6 hours or in combination with ibuprofen 400-600mg  every 6-8 hours as needed for pain and inflammation.  Be sure to have food on your stomach when taking ibuprofen to help prevent stomach ulcers.     ED Prescriptions    None     PDMP not reviewed this encounter.   , PA-C 02/24/20 1051

## 2020-02-24 NOTE — Discharge Instructions (Signed)
  Your imaging was normal today, however, if your pain continues, you may need further evaluation by Sports Medicine next week.  You may wear the boot for comfort or use your crutches to help keep weight off your foot while you are having pain.  You may take 500mg  acetaminophen every 4-6 hours or in combination with ibuprofen 400-600mg  every 6-8 hours as needed for pain and inflammation.  Be sure to have food on your stomach when taking ibuprofen to help prevent stomach ulcers.

## 2022-03-25 IMAGING — DX DG ANKLE COMPLETE 3+V*R*
3 series · 3 of 3 positions shown · non-contrast
Comparison: None.

CLINICAL DATA: Acute right ankle pain after walking stairs.

EXAM:
RIGHT ANKLE - COMPLETE 3+ VIEW

[ankle ap]
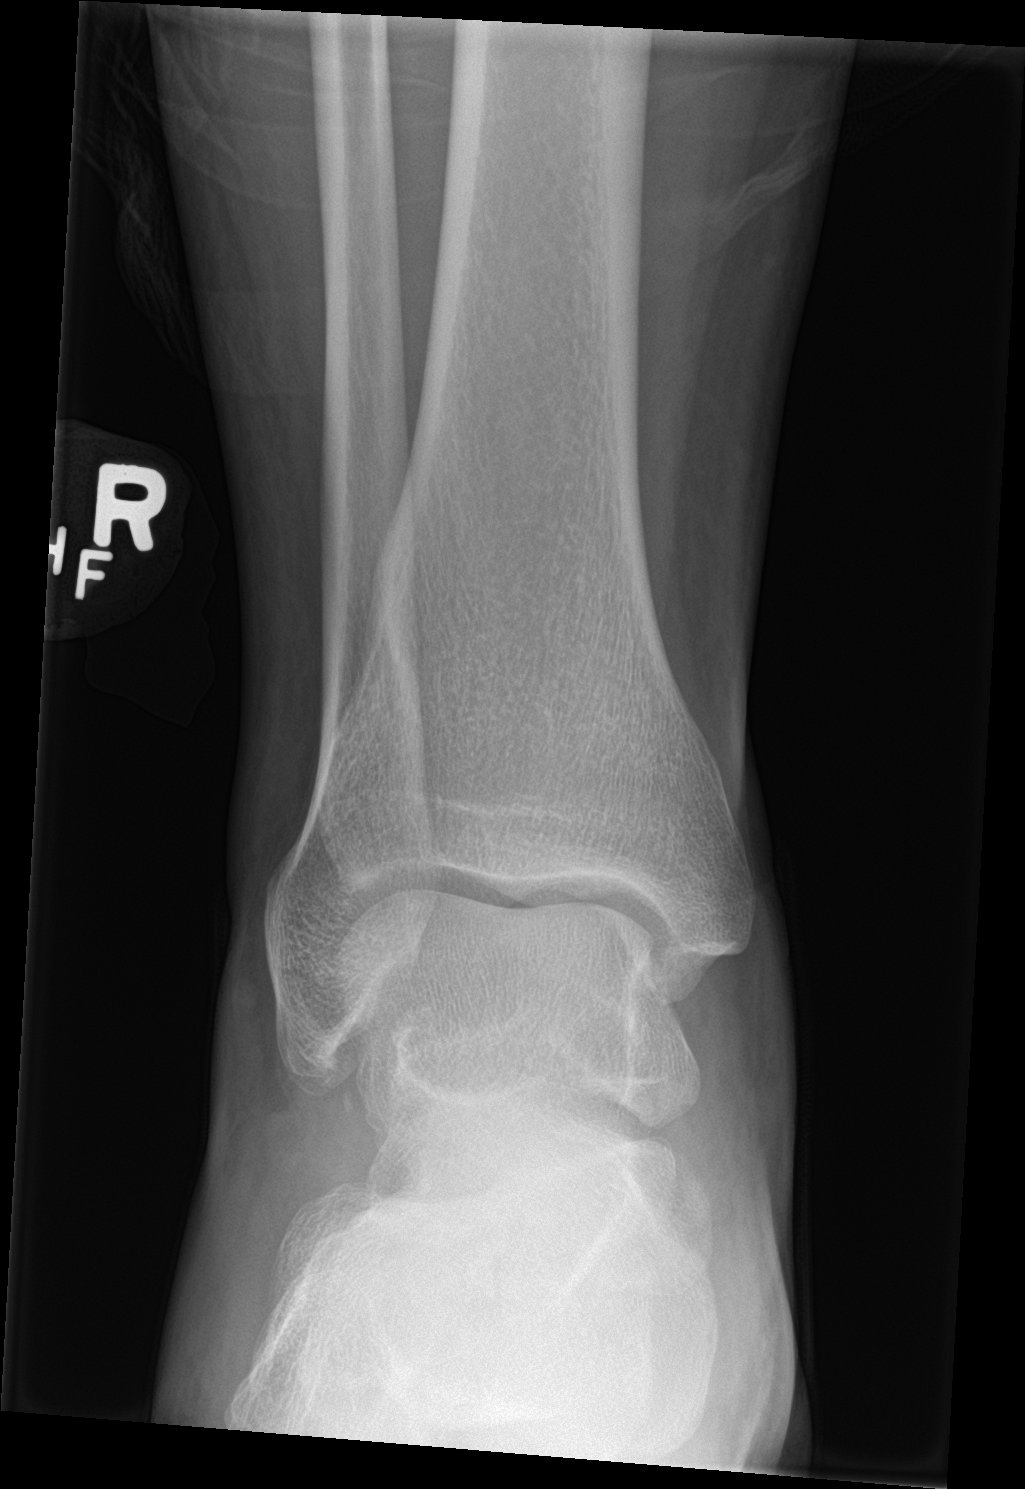

[ankle obl]
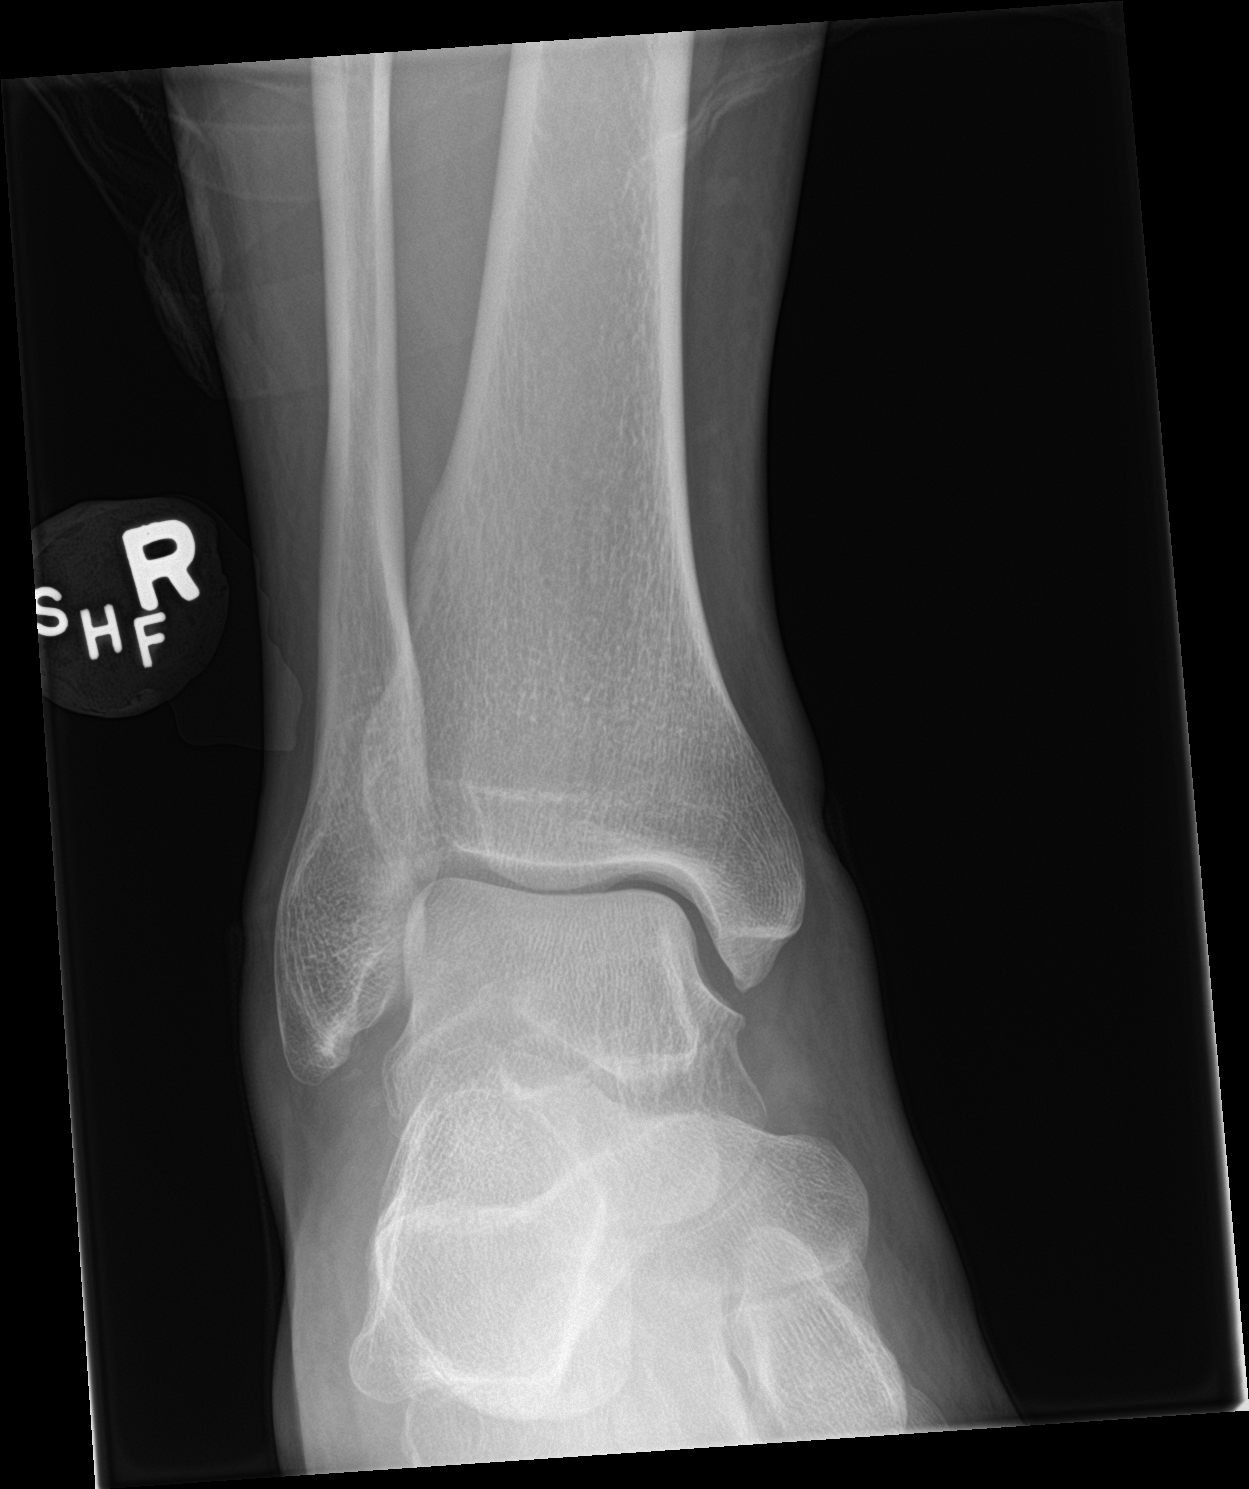

[ankle lat]
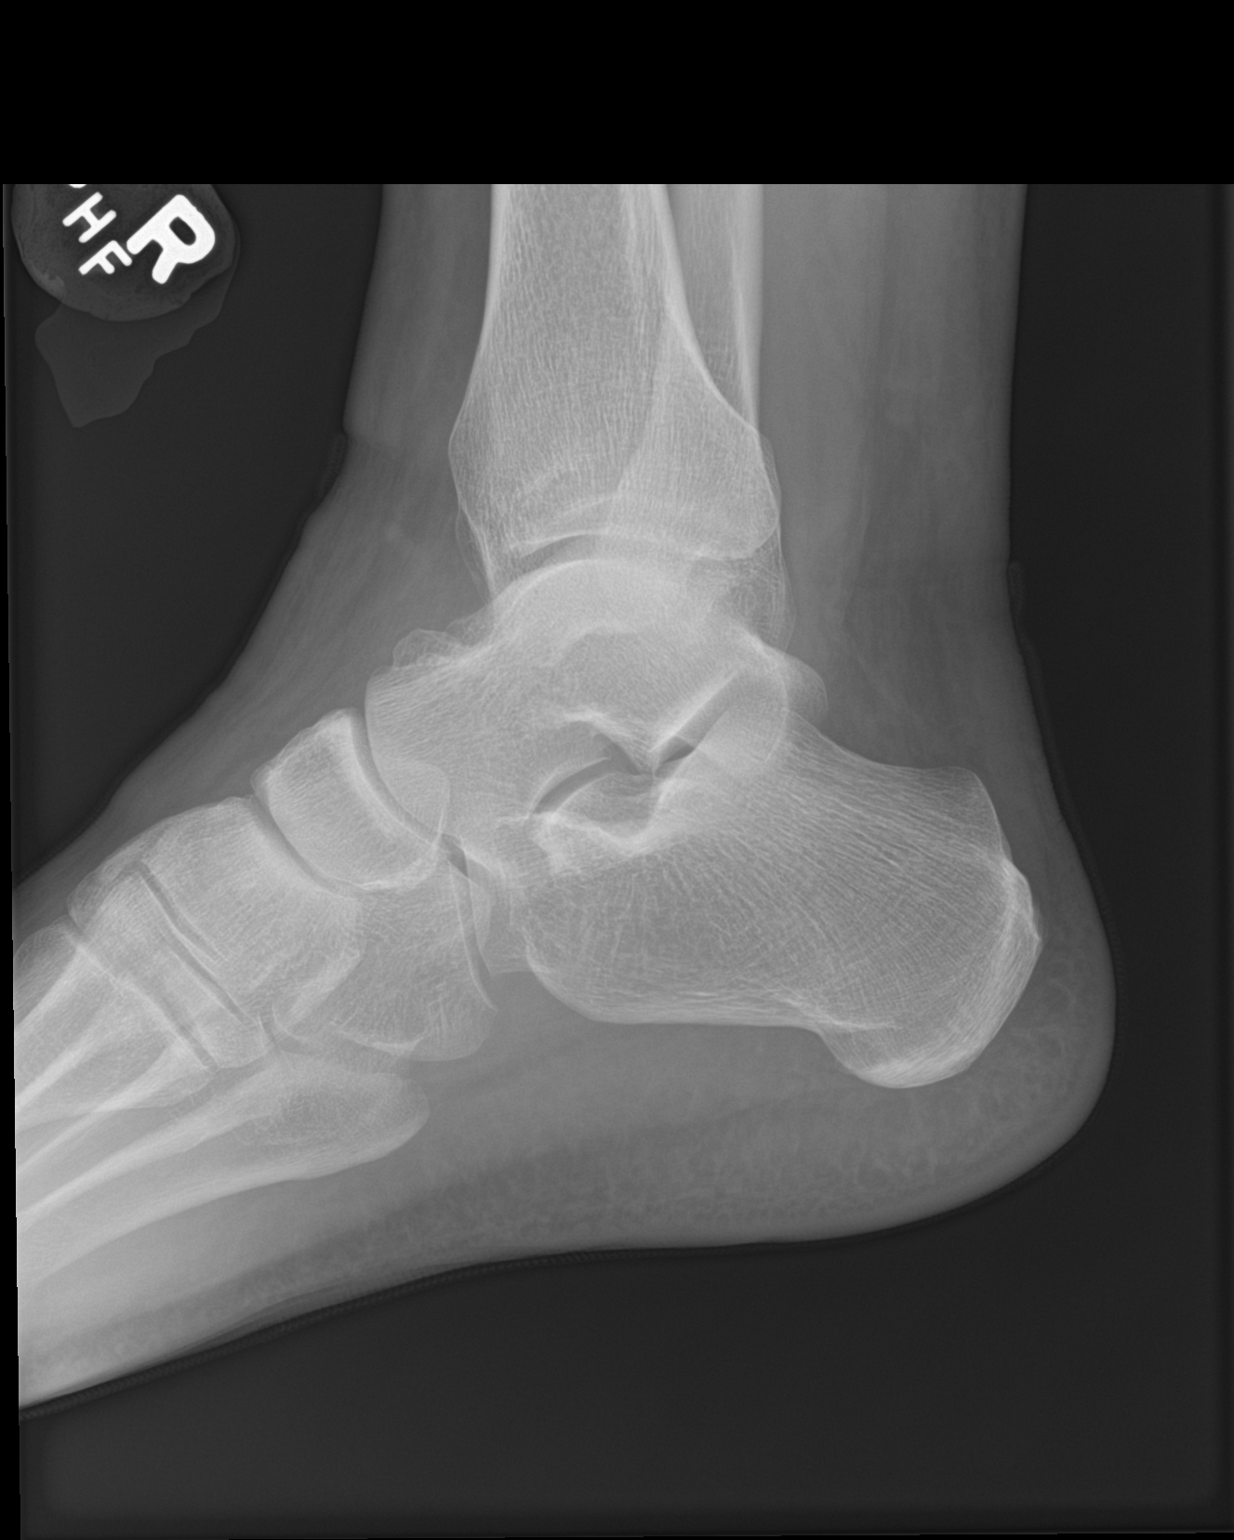

[3 of 3 positions shown; findings below may reference images not displayed]

FINDINGS: There is no evidence of fracture, dislocation, or joint effusion.
There is no evidence of arthropathy or other focal bone abnormality.
Soft tissues are unremarkable.
IMPRESSION: Negative.

## 2022-03-25 IMAGING — DX DG FOOT COMPLETE 3+V*R*
3 series · 3 of 3 positions shown · non-contrast
Comparison: None.

CLINICAL DATA: Acute right foot pain after walking up stairs.

EXAM:
RIGHT FOOT COMPLETE - 3+ VIEW

[foot ap]
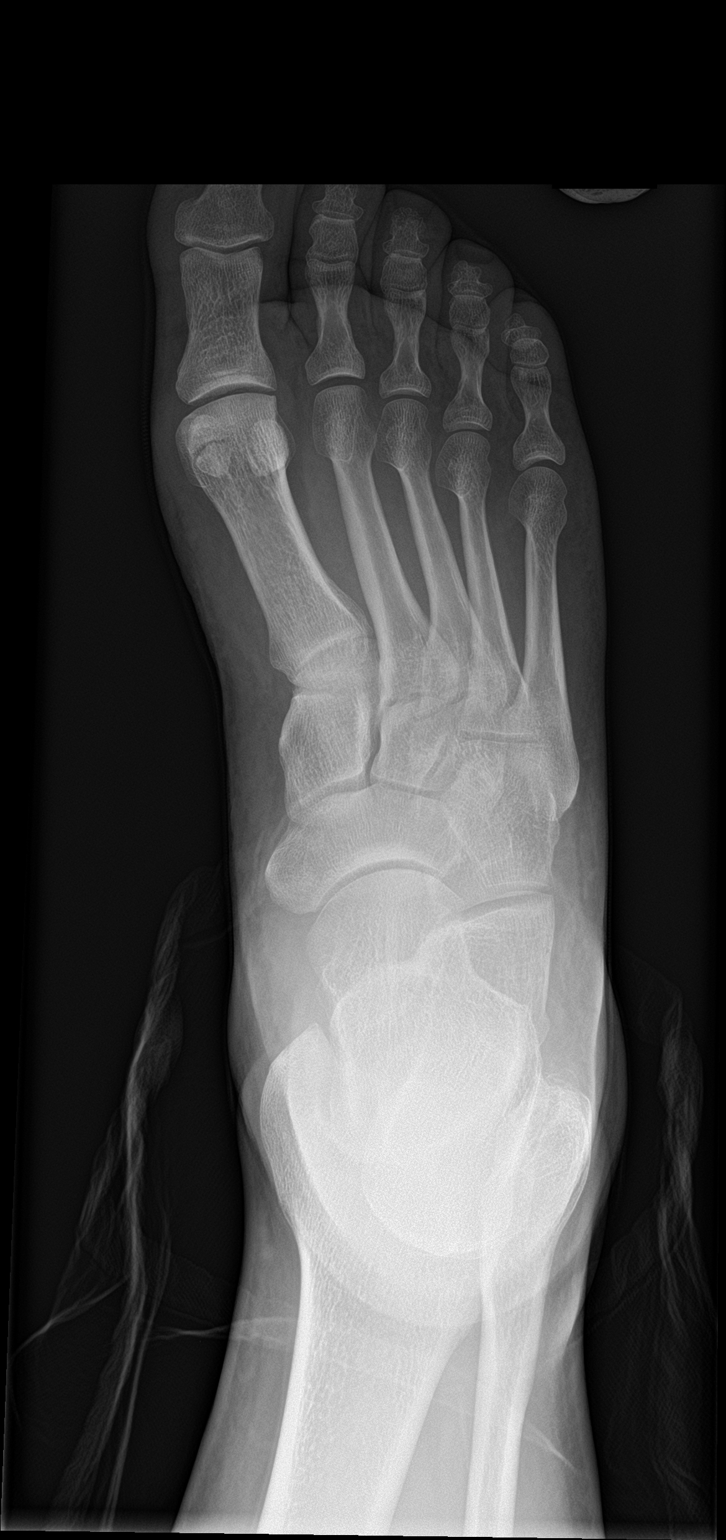

[foot obl]
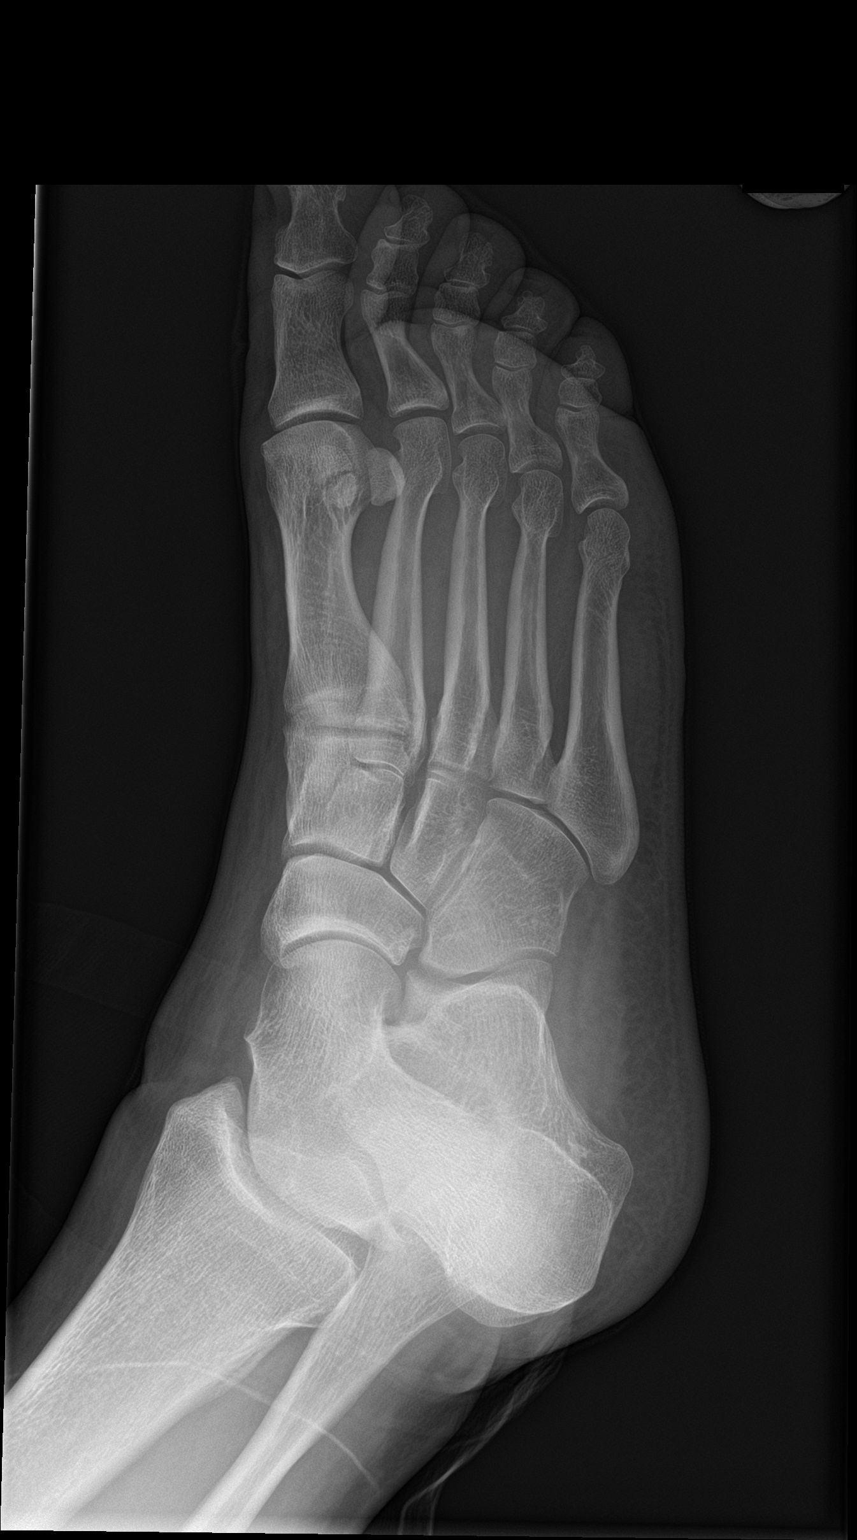

[foot lat]
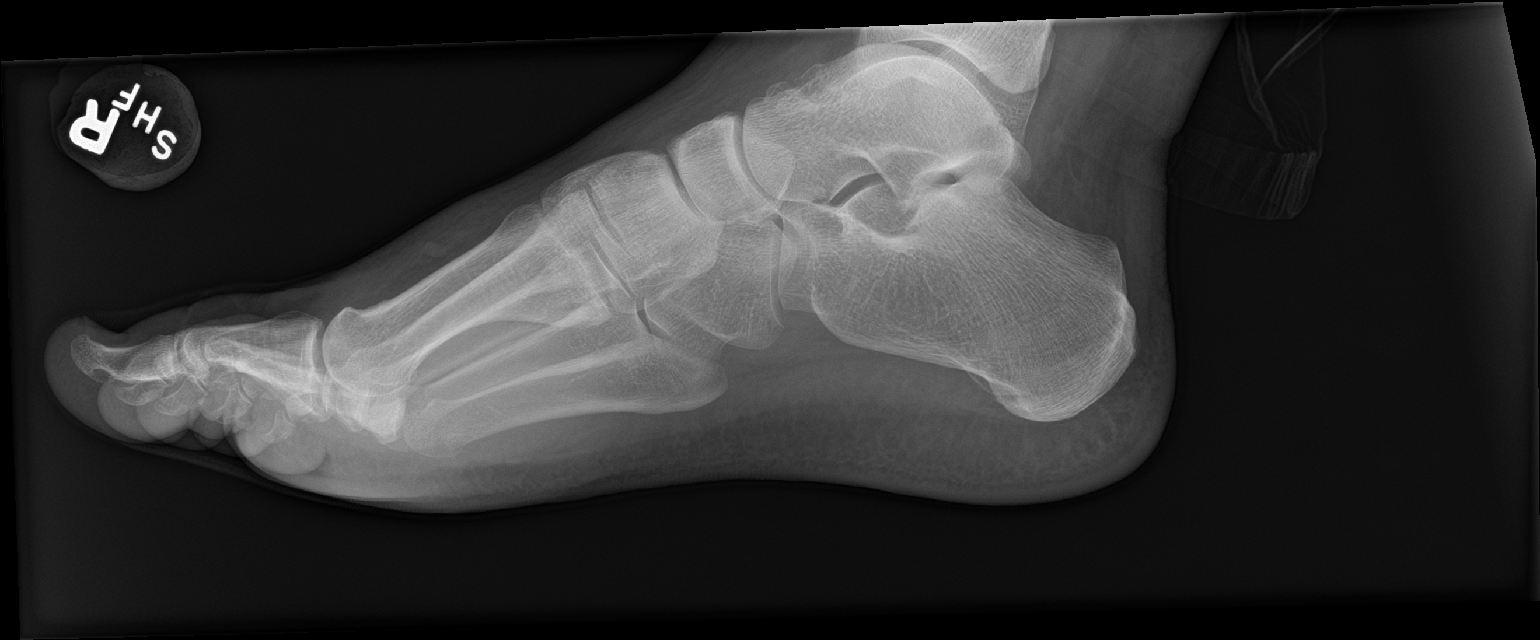

[3 of 3 positions shown; findings below may reference images not displayed]

FINDINGS: There is no evidence of fracture or dislocation. There is no
evidence of arthropathy or other focal bone abnormality. Soft
tissues are unremarkable.
IMPRESSION: Negative.
